# Patient Record
Sex: Female | Born: 2005 | Race: White | Hispanic: No | Marital: Single | Smoking: Never smoker
Health system: Southern US, Community
[De-identification: ages and names within clinical notes are randomized; demographics above are authoritative.]

## PROBLEM LIST (undated history)

## (undated) DIAGNOSIS — K219 Gastro-esophageal reflux disease without esophagitis: Secondary | ICD-10-CM

## (undated) DIAGNOSIS — J45909 Unspecified asthma, uncomplicated: Secondary | ICD-10-CM

## (undated) HISTORY — DX: Unspecified asthma, uncomplicated: J45.909

## (undated) HISTORY — PX: TONSILLECTOMY AND ADENOIDECTOMY: SUR1326

## (undated) HISTORY — PX: DENTAL SURGERY: SHX609

---
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
## 2023-01-04 ENCOUNTER — Encounter: Payer: Medicaid Other | Admitting: Obstetrics and Gynecology

## 2023-01-10 ENCOUNTER — Encounter: Payer: Medicaid Other | Admitting: Obstetrics

## 2023-01-25 ENCOUNTER — Telehealth: Payer: Self-pay

## 2023-01-25 NOTE — Telephone Encounter (Signed)
Pt calling; is having really really really bad pain in upper abd to the left side; pain is sharp then feels like someone is twisting something; pain comes and goes; just want to be sure everything is okay.  [phone]  Pt states a family member adv her to lie on her left side and sip on juice which pt is doing and it seems to be helping.  Adv pt to go to ER if pain continues in spite of lying on left side and sipping juice as we home no availability here.

## 2023-02-05 ENCOUNTER — Ambulatory Visit (INDEPENDENT_AMBULATORY_CARE_PROVIDER_SITE_OTHER): Payer: Medicaid Other | Admitting: Certified Nurse Midwife

## 2023-02-05 ENCOUNTER — Encounter: Payer: Self-pay | Admitting: Certified Nurse Midwife

## 2023-02-05 VITALS — BP 101/62 | HR 90 | Wt 130.2 lb

## 2023-02-05 DIAGNOSIS — Z23 Encounter for immunization: Secondary | ICD-10-CM

## 2023-02-05 DIAGNOSIS — Z3A29 29 weeks gestation of pregnancy: Secondary | ICD-10-CM

## 2023-02-05 DIAGNOSIS — Z3483 Encounter for supervision of other normal pregnancy, third trimester: Secondary | ICD-10-CM

## 2023-02-05 LAB — POCT URINALYSIS DIPSTICK OB
Bilirubin, UA: NEGATIVE
Blood, UA: NEGATIVE
Glucose, UA: NEGATIVE
Ketones, UA: NEGATIVE
Nitrite, UA: NEGATIVE
Spec Grav, UA: 1.015 (ref 1.010–1.025)
Urobilinogen, UA: 0.2 E.U./dL
pH, UA: 6 (ref 5.0–8.0)

## 2023-02-05 NOTE — Progress Notes (Signed)
ROB doing well. Feels good movement. Pt will return for lab visit this week to complete 28 wk  Glucose screen/RPR/CBC. Tdap done today, Blood transfusion consent completed, all questions answered. . Sample birth plan given, will follow up in upcoming visits. Discussed birth control after delivery, information pamphlet given.   Follow up 2 wk  for ROB or sooner if needed.    Doreene Burke, CNM

## 2023-02-05 NOTE — Patient Instructions (Signed)
[facility]  [facility]  [address], [place], [place] [address]  Phone: [phone]  [facility] (second location)  [address] [place], [place] [address]  Phone: [phone]  [facility] ([place]) [address], [place], [place] [address] Phone: [phone]  [facility]  [address] [place], [place] [address]  Phone: [phone] 

## 2023-02-06 ENCOUNTER — Encounter: Payer: Self-pay | Admitting: Certified Nurse Midwife

## 2023-02-08 ENCOUNTER — Other Ambulatory Visit: Payer: Medicaid Other

## 2023-02-09 LAB — 28 WEEK RH+PANEL
Basophils Absolute: 0.1 10*3/uL (ref 0.0–0.2)
Basos: 0 %
EOS (ABSOLUTE): 0.2 10*3/uL (ref 0.0–0.4)
Eos: 2 %
Gestational Diabetes Screen: 114 mg/dL (ref 70–139)
HIV Screen 4th Generation wRfx: NONREACTIVE
Hematocrit: 35.1 % (ref 34.0–46.6)
Hemoglobin: 11.9 g/dL (ref 11.1–15.9)
Immature Grans (Abs): 0.2 10*3/uL — ABNORMAL HIGH (ref 0.0–0.1)
Immature Granulocytes: 1 %
Lymphocytes Absolute: 2 10*3/uL (ref 0.7–3.1)
Lymphs: 18 %
MCH: 30.1 pg (ref 26.6–33.0)
MCHC: 33.9 g/dL (ref 31.5–35.7)
MCV: 89 fL (ref 79–97)
Monocytes Absolute: 0.8 10*3/uL (ref 0.1–0.9)
Monocytes: 7 %
Neutrophils Absolute: 8.2 10*3/uL — ABNORMAL HIGH (ref 1.4–7.0)
Neutrophils: 72 %
Platelets: 220 10*3/uL (ref 150–450)
RBC: 3.95 x10E6/uL (ref 3.77–5.28)
RDW: 12.4 % (ref 11.7–15.4)
RPR Ser Ql: NONREACTIVE
WBC: 11.4 10*3/uL — ABNORMAL HIGH (ref 3.4–10.8)

## 2023-02-26 ENCOUNTER — Encounter: Payer: Medicaid Other | Admitting: Obstetrics

## 2023-02-26 DIAGNOSIS — O09899 Supervision of other high risk pregnancies, unspecified trimester: Secondary | ICD-10-CM

## 2023-02-26 DIAGNOSIS — O99320 Drug use complicating pregnancy, unspecified trimester: Secondary | ICD-10-CM

## 2023-02-26 DIAGNOSIS — Z3480 Encounter for supervision of other normal pregnancy, unspecified trimester: Secondary | ICD-10-CM

## 2023-02-27 ENCOUNTER — Telehealth: Payer: Self-pay | Admitting: Obstetrics

## 2023-02-27 NOTE — Telephone Encounter (Signed)
Reached out to pt to reschedule ROB appt that was scheduled on 02/26/2023 at 10:15 with Melissa.  Was able to reschedule the appt for 03/12/23 at 10:15 with Hartley Barefoot.  Added the pt to [facility] in case a sooner appt opened up.

## 2023-03-12 ENCOUNTER — Ambulatory Visit (INDEPENDENT_AMBULATORY_CARE_PROVIDER_SITE_OTHER): Payer: Medicaid Other | Admitting: Certified Nurse Midwife

## 2023-03-12 VITALS — BP 101/67 | HR 79 | Wt 132.0 lb

## 2023-03-12 DIAGNOSIS — Z3A34 34 weeks gestation of pregnancy: Secondary | ICD-10-CM

## 2023-03-12 DIAGNOSIS — Z3483 Encounter for supervision of other normal pregnancy, third trimester: Secondary | ICD-10-CM

## 2023-03-12 DIAGNOSIS — Z3480 Encounter for supervision of other normal pregnancy, unspecified trimester: Secondary | ICD-10-CM

## 2023-03-12 MED ORDER — FAMOTIDINE 20 MG PO TABS: 20.0000 mg | ORAL_TABLET | Freq: Two times a day (BID) | ORAL | 1 refills | Status: DC

## 2023-03-12 NOTE — Progress Notes (Signed)
   PRENATAL VISIT NOTE  Subjective:  Whitney Rios is a 17 y.o. G2P1001 at [redacted]w[redacted]d being seen today for ongoing prenatal care.  She is currently monitored for the following issues for this low-risk pregnancy and has Supervision of other normal pregnancy, antepartum; Short interval between pregnancies affecting pregnancy, antepartum; and Marijuana use during pregnancy on their problem list.  Patient reports no complaints.  Contractions: Irregular. Vag. Bleeding: None.  Movement: Present. Denies leaking of fluid.   The following portions of the patient's history were reviewed and updated as appropriate: allergies, current medications, past family history, past medical history, past social history, past surgical history and problem list.   Objective:   Vitals:   03/12/23 1012  BP: 101/67  Pulse: 79  Weight: 132 lb (59.9 kg)   Total weight gain: 32 lb (14.5 kg) Fetal Status: Fetal Heart Rate (bpm): 145 Fundal Height: 33 cm Movement: Present  Presentation: Vertex   General:  Alert, oriented and cooperative. Patient is in no acute distress.  Skin: Skin is warm and dry. No rash noted.   Cardiovascular: Normal heart rate noted  Respiratory: Normal respiratory effort, no problems with respiration noted  Abdomen: Soft, gravid, appropriate for gestational age.  Pain/Pressure: Present     Pelvic: Cervical exam deferred        Extremities: Normal range of motion.  Edema: None  Mental Status: Normal mood and affect. Normal behavior. Normal judgment and thought content.   Assessment and Plan:  Pregnancy: G2P1001 at [redacted]w[redacted]d 1. Supervision of other normal pregnancy, antepartum  2. [redacted] weeks gestation of pregnancy   Preterm labor symptoms and general obstetric precautions including but not limited to vaginal bleeding, contractions, leaking of fluid and fetal movement were reviewed in detail with the patient. Please refer to After Visit Summary for other counseling recommendations.   GBS & Gc/Ct at  next visit. Options for contraception reviewed.  Return in 2 weeks (on 03/26/2023) for ROB.  No future appointments.  Dominica Severin, CNM

## 2023-03-12 NOTE — Patient Instructions (Signed)
Third Trimester of Pregnancy  The third trimester of pregnancy is from week 28 through week 40. This is months 7 through 9. The third trimester is a time when the unborn baby (fetus) is growing rapidly. At the end of the ninth month, the fetus is about 20 inches long and weighs 6-10 pounds. Body changes during your third trimester During the third trimester, your body will continue to go through many changes. The changes vary and generally return to normal after your baby is born. Physical changes Your weight will continue to increase. You can expect to gain 25-35 pounds (11-16 kg) by the end of the pregnancy if you begin pregnancy at a normal weight. If you are underweight, you can expect to gain 28-40 lb (about 13-18 kg), and if you are overweight, you can expect to gain 15-25 lb (about 7-11 kg). You may begin to get stretch marks on your hips, abdomen, and breasts. Your breasts will continue to grow and may hurt. A yellow fluid (colostrum) may leak from your breasts. This is the first milk you are producing for your baby. You may have changes in your hair. These can include thickening of your hair, rapid growth, and changes in texture. Some people also have hair loss during or after pregnancy, or hair that feels dry or thin. Your belly button may stick out. You may notice more swelling in your hands, face, or ankles. Health changes You may have heartburn. You may have constipation. You may develop hemorrhoids. You may develop swollen, bulging veins (varicose veins) in your legs. You may have increased body aches in the pelvis, back, or thighs. This is due to weight gain and increased hormones that are relaxing your joints. You may have increased tingling or numbness in your hands, arms, and legs. The skin on your abdomen may also feel numb. You may feel short of breath because of your expanding uterus. Other changes You may urinate more often because the fetus is moving lower into your pelvis  and pressing on your bladder. You may have more problems sleeping. This may be caused by the size of your abdomen, an increased need to urinate, and an increase in your body's metabolism. You may notice the fetus "dropping," or moving lower in your abdomen (lightening). You may have increased vaginal discharge. You may notice that you have pain around your pelvic bone as your uterus distends. Follow these instructions at home: Medicines Follow your health care provider's instructions regarding medicine use. Specific medicines may be either safe or unsafe to take during pregnancy. Do not take any medicines unless approved by your health care provider. Take a prenatal vitamin that contains at least 600 micrograms (mcg) of folic acid. Eating and drinking Eat a healthy diet that includes fresh fruits and vegetables, whole grains, good sources of protein such as meat, eggs, or tofu, and low-fat dairy products. Avoid raw meat and unpasteurized juice, milk, and cheese. These carry germs that can harm you and your baby. Eat 4 or 5 small meals rather than 3 large meals a day. You may need to take these actions to prevent or treat constipation: Drink enough fluid to keep your urine pale yellow. Eat foods that are high in fiber, such as beans, whole grains, and fresh fruits and vegetables. Limit foods that are high in fat and processed sugars, such as fried or sweet foods. Activity Exercise only as directed by your health care provider. Most people can continue their usual exercise routine during pregnancy. Try   to exercise for 30 minutes at least 5 days a week. Stop exercising if you experience contractions in the uterus. Stop exercising if you develop pain or cramping in the lower abdomen or lower back. Avoid heavy lifting. Do not exercise if it is very hot or humid or if you are at a high altitude. If you choose to, you may continue to have sex unless your health care provider tells you not  to. Relieving pain and discomfort Take frequent breaks and rest with your legs raised (elevated) if you have leg cramps or low back pain. Take warm sitz baths to soothe any pain or discomfort caused by hemorrhoids. Use hemorrhoid cream if your health care provider approves. Wear a supportive bra to prevent discomfort from breast tenderness. If you develop varicose veins: Wear support hose as told by your health care provider. Elevate your feet for 15 minutes, 3-4 times a day. Limit salt in your diet. Safety Talk to your health care provider before traveling far distances. Do not use hot tubs, steam rooms, or saunas. Wear your seat belt at all times when driving or riding in a car. Talk with your health care provider if someone is verbally or physically abusive to you. Preparing for birth To prepare for the arrival of your baby: Take prenatal classes to understand, practice, and ask questions about labor and delivery. Visit the hospital and tour the maternity area. Purchase a rear-facing car seat and make sure you know how to install it in your car. Prepare the baby's room or sleeping area. Make sure to remove all pillows and stuffed animals from the baby's crib to prevent suffocation. General instructions Avoid cat litter boxes and soil used by cats. These carry germs that can cause birth defects in the baby. If you have a cat, ask someone to clean the litter box for you. Do not douche or use tampons. Do not use scented sanitary pads. Do not use any products that contain nicotine or tobacco, such as cigarettes, e-cigarettes, and chewing tobacco. If you need help quitting, ask your health care provider. Do not use any herbal remedies, illegal drugs, or medicines that were not prescribed to you. Chemicals in these products can harm your baby. Do not drink alcohol. You will have more frequent prenatal exams during the third trimester. During a routine prenatal visit, your health care provider  will do a physical exam, perform tests, and discuss your overall health. Keep all follow-up visits. This is important. Where to find more information American Pregnancy Association: americanpregnancy.org American College of Obstetricians and Gynecologists: acog.org/en/Womens%20Health/Pregnancy Office on Women's Health: womenshealth.gov/pregnancy Contact a health care provider if you have: A fever. Mild pelvic cramps, pelvic pressure, or nagging pain in your abdominal area or lower back. Vomiting or diarrhea. Bad-smelling vaginal discharge or foul-smelling urine. Pain when you urinate. A headache that does not go away when you take medicine. Visual changes or see spots in front of your eyes. Get help right away if: Your water breaks. You have regular contractions less than 5 minutes apart. You have spotting or bleeding from your vagina. You have severe abdominal pain. You have difficulty breathing. You have chest pain. You have fainting spells. You have not felt your baby move for the time period told by your health care provider. You have new or increased pain, swelling, or redness in an arm or leg. Summary The third trimester of pregnancy is from week 28 through week 40 (months 7 through 9). You may have more problems   sleeping. This can be caused by the size of your abdomen, an increased need to urinate, and an increase in your body's metabolism. You will have more frequent prenatal exams during the third trimester. Keep all follow-up visits. This is important. This information is not intended to replace advice given to you by your health care provider. Make sure you discuss any questions you have with your health care provider. Document Revised: 03/03/2020 Document Reviewed: 01/08/2020 Elsevier Patient Education  2024 Elsevier Inc.  Group B Streptococcus Infection During Pregnancy Group B Streptococcus (GBS) is a type of bacteria that is often found in healthy people. It is  commonly found in the rectum, vagina, and intestines. In people who are healthy and not pregnant, the bacteria rarely cause serious illness or complications. However, women who test positive for GBS during pregnancy can pass the bacteria to the baby during childbirth. This can cause serious infection in the baby after birth. Women with GBS may also have infections during their pregnancy or soon after childbirth. The infections include urinary tract infections (UTIs) or infections of the uterus. GBS also increases a woman's risk of complications during pregnancy, such as early labor or delivery, miscarriage, or stillbirth. Routine testing for GBS is recommended for all pregnant women. What are the causes? This condition is caused by bacteria called Streptococcus agalactiae. What increases the risk? You may have a higher risk for GBS infection during pregnancy if you had one during a past pregnancy. What are the signs or symptoms? In most cases, GBS infection does not cause symptoms in pregnant women. If symptoms exist, they may include: Labor that starts before the 37th week of pregnancy. A UTI or bladder infection. This may cause a fever, frequent urination, or pain and burning during urination. Fever during labor. There can also be a rapid heartbeat in the mother or baby. Rare but serious symptoms of a GBS infection in women include: Blood infection (septicemia). This may cause fever, chills, or confusion. Lung infection (pneumonia). This may cause fever, chills, cough, rapid breathing, chest pain, or difficulty breathing. Bone, joint, skin, or soft tissue infection. How is this diagnosed? You may be screened for GBS between week 35 and week 37 of pregnancy. If you have symptoms of preterm labor, you may be screened earlier. This condition is diagnosed based on lab test results from: A swab of fluid from the vagina and rectum. A urine sample. How is this treated? This condition is treated with  antibiotic medicine. Antibiotic medicine may be given: To you when you go into labor, or as soon as your water breaks. The medicines will continue until after you give birth. If you are having a cesarean delivery, you do not need antibiotics unless your water has broken. To your baby, if he or she requires treatment. Your health care provider will check your baby to decide if he or she needs antibiotics to prevent a serious infection. Follow these instructions at home: Take over-the-counter and prescription medicines only as told by your health care provider. Take your antibiotic medicine as told by your health care provider. Do not stop taking the antibiotic even if you start to feel better. Keep all pre-birth (prenatal) visits and follow-up visits as told by your health care provider. This is important. Contact a health care provider if: You have pain or burning when you urinate. You have to urinate more often than usual. You have a fever or chills. You develop a bad-smelling vaginal discharge. Get help right away if:   Your water breaks. You go into labor. You have severe pain in your abdomen. You have difficulty breathing. You have chest pain. These symptoms may represent a serious problem that is an emergency. Do not wait to see if the symptoms will go away. Get medical help right away. Call your local emergency services (911 in the [place] Do not drive yourself to the hospital. Summary GBS is a type of bacteria that is common in healthy people. During pregnancy, colonization with GBS can cause serious complications for you or your baby. Your health care provider will screen you between 35 and 37 weeks of pregnancy to determine if you are colonized with GBS. If you are colonized with GBS during pregnancy, your health care provider will recommend antibiotics through an IV during labor. After delivery, your baby will be evaluated for complications related to potential GBS infection and may  require antibiotics to prevent a serious infection. This information is not intended to replace advice given to you by your health care provider. Make sure you discuss any questions you have with your health care provider. Document Revised: 09/11/2022 Document Reviewed: 09/11/2022 Elsevier Patient Education  2024 Elsevier Inc.  

## 2023-03-23 DIAGNOSIS — Z2839 Other underimmunization status: Secondary | ICD-10-CM | POA: Insufficient documentation

## 2023-03-27 ENCOUNTER — Other Ambulatory Visit (HOSPITAL_COMMUNITY)
Admission: RE | Admit: 2023-03-27 | Discharge: 2023-03-27 | Disposition: A | Discharge status: Home / Self Care | Payer: Medicaid Other | Source: Ambulatory Visit

## 2023-03-27 ENCOUNTER — Ambulatory Visit (INDEPENDENT_AMBULATORY_CARE_PROVIDER_SITE_OTHER): Payer: Medicaid Other

## 2023-03-27 VITALS — BP 114/70 | HR 96 | Wt 138.0 lb

## 2023-03-27 DIAGNOSIS — Z3480 Encounter for supervision of other normal pregnancy, unspecified trimester: Secondary | ICD-10-CM | POA: Insufficient documentation

## 2023-03-27 DIAGNOSIS — O365990 Maternal care for other known or suspected poor fetal growth, unspecified trimester, not applicable or unspecified: Secondary | ICD-10-CM | POA: Insufficient documentation

## 2023-03-27 DIAGNOSIS — O26849 Uterine size-date discrepancy, unspecified trimester: Secondary | ICD-10-CM | POA: Insufficient documentation

## 2023-03-27 DIAGNOSIS — Z2839 Other underimmunization status: Secondary | ICD-10-CM

## 2023-03-27 DIAGNOSIS — Z3A36 36 weeks gestation of pregnancy: Secondary | ICD-10-CM

## 2023-03-27 DIAGNOSIS — O26843 Uterine size-date discrepancy, third trimester: Secondary | ICD-10-CM

## 2023-03-27 LAB — POCT URINALYSIS DIPSTICK OB
Bilirubin, UA: NEGATIVE
Blood, UA: NEGATIVE
Glucose, UA: NEGATIVE
Ketones, UA: NEGATIVE
Leukocytes, UA: NEGATIVE
Nitrite, UA: NEGATIVE
POC,PROTEIN,UA: NEGATIVE
Spec Grav, UA: 1.01 (ref 1.010–1.025)
Urobilinogen, UA: 1 E.U./dL
pH, UA: 6.5 (ref 5.0–8.0)

## 2023-03-27 NOTE — Progress Notes (Signed)
    Return Prenatal Note   Assessment/Plan   Plan  17 y.o. G2P1001 at [redacted]w[redacted]d presents for follow-up OB visit. Reviewed prenatal record including previous visit note.  Supervision of other normal pregnancy, antepartum GBS and GC/CT swabs collected today. Provided resources for encouraging timely labor. Reviewed labor warning signs and expectations for birth. Instructed to call office or come to hospital with persistent headache, vision changes, regular contractions, leaking of fluid, decreased fetal movement or vaginal bleeding.  Significant discrepancy between uterine size and clinical dates, antepartum Measuring 32 cm at 36 weeks today. Growth ultrasound ordered.    Orders Placed This Encounter  Procedures   Culture, beta strep (group b only)   US OB Follow Up    Standing Status:   Future    Standing Expiration Date:   03/26/2024    Order Specific Question:   Reason for Exam (SYMPTOM  OR DIAGNOSIS REQUIRED)    Answer:   S<D    Order Specific Question:   Preferred Imaging Location?    Answer:   Internal   POC Urinalysis Dipstick OB   Return in about 1 week (around 04/03/2023) for ROB.   Future Appointments  Date Time Provider Department Center  04/03/2023  1:55 PM Linzie Collin, MD [facility] None    For next visit:   growth ultrasound     Subjective   17 y.o. G2P1001 at [redacted]w[redacted]d presents for this follow-up prenatal visit.  Patient has no concerns.  Patient reports: Movement: Present Contractions: Irritability  Objective   Flow sheet Vitals: Pulse Rate: 96 BP: 114/70 Fundal Height: 32 cm Fetal Heart Rate (bpm): 125 Presentation: Vertex Total weight gain: 38 lb (17.2 kg)  General Appearance  No acute distress, well appearing, and well nourished Pulmonary   Normal work of breathing Neurologic   Alert and oriented to person, place, and time Psychiatric   Mood and affect within normal limits  Lindalou Hose Envy Meno, CNM  03/26/2410:28 AM

## 2023-03-27 NOTE — Assessment & Plan Note (Addendum)
GBS and GC/CT swabs collected today. Provided resources for encouraging timely labor. Reviewed labor warning signs and expectations for birth. Instructed to call office or come to hospital with persistent headache, vision changes, regular contractions, leaking of fluid, decreased fetal movement or vaginal bleeding.

## 2023-03-27 NOTE — Assessment & Plan Note (Signed)
Measuring 32 cm at 36 weeks today. Growth ultrasound ordered.

## 2023-03-28 LAB — CERVICOVAGINAL ANCILLARY ONLY
Chlamydia: NEGATIVE
Comment: NEGATIVE
Comment: NORMAL
Neisseria Gonorrhea: NEGATIVE

## 2023-03-29 ENCOUNTER — Telehealth: Payer: Self-pay

## 2023-03-29 ENCOUNTER — Ambulatory Visit
Admission: RE | Admit: 2023-03-29 | Discharge: 2023-03-29 | Disposition: A | Discharge status: Home / Self Care | Payer: Medicaid Other | Source: Ambulatory Visit

## 2023-03-29 ENCOUNTER — Other Ambulatory Visit: Payer: Self-pay

## 2023-03-29 DIAGNOSIS — O26849 Uterine size-date discrepancy, unspecified trimester: Secondary | ICD-10-CM | POA: Diagnosis present

## 2023-03-29 DIAGNOSIS — O365990 Maternal care for other known or suspected poor fetal growth, unspecified trimester, not applicable or unspecified: Secondary | ICD-10-CM

## 2023-03-29 NOTE — Telephone Encounter (Signed)
Call to patient to confirm induction scheduled for 6/25 at midnight. Patient instructed to come to [facility] emergency department. Patient expressed understanding of instructions.

## 2023-03-29 NOTE — Telephone Encounter (Signed)
Verified name and DOB. Reviewed ultrasound findings from today of severe FGR <1%. Discussed patient with Dr. Valentino Saxon who recommends BPP with doppler as soon as possible and plan for induction of labor at 37 wks. MFM scheduling has been contacted to set up [place] appointments. Patient will be scheduled for induction on 6/24 or 6/25. She is aware of the plan and expressed understanding of the recommendation.

## 2023-03-30 ENCOUNTER — Encounter: Payer: Self-pay | Admitting: *Deleted

## 2023-03-30 ENCOUNTER — Ambulatory Visit: Payer: Medicaid Other | Attending: Obstetrics and Gynecology

## 2023-03-30 ENCOUNTER — Other Ambulatory Visit: Payer: Self-pay

## 2023-03-30 ENCOUNTER — Ambulatory Visit: Payer: Medicaid Other | Admitting: *Deleted

## 2023-03-30 VITALS — BP 115/55 | HR 79

## 2023-03-30 DIAGNOSIS — O365990 Maternal care for other known or suspected poor fetal growth, unspecified trimester, not applicable or unspecified: Secondary | ICD-10-CM

## 2023-03-30 DIAGNOSIS — F1290 Cannabis use, unspecified, uncomplicated: Secondary | ICD-10-CM

## 2023-03-30 DIAGNOSIS — O365930 Maternal care for other known or suspected poor fetal growth, third trimester, not applicable or unspecified: Secondary | ICD-10-CM

## 2023-03-30 DIAGNOSIS — O99323 Drug use complicating pregnancy, third trimester: Secondary | ICD-10-CM | POA: Diagnosis not present

## 2023-03-30 DIAGNOSIS — Z3A36 36 weeks gestation of pregnancy: Secondary | ICD-10-CM | POA: Diagnosis not present

## 2023-03-30 DIAGNOSIS — F17210 Nicotine dependence, cigarettes, uncomplicated: Secondary | ICD-10-CM | POA: Diagnosis present

## 2023-03-30 LAB — CULTURE, BETA STREP (GROUP B ONLY): Strep Gp B Culture: NEGATIVE

## 2023-03-30 NOTE — Procedures (Signed)
Whitney Rios 24-Apr-2006 [redacted]w[redacted]d  Fetus A Non-Stress Test Interpretation for 03/30/23  Indication: IUGR and smoker  Fetal Heart Rate A Mode: External Baseline Rate (A): 125 bpm Variability: Moderate Accelerations: 15 x 15 Decelerations: None Multiple birth?: No  Uterine Activity Mode: Toco Contraction Frequency (min): occas Contraction Duration (sec): 40-60 Contraction Quality: Mild Resting Tone Palpated: Relaxed  Interpretation (Fetal Testing) Nonstress Test Interpretation: Reactive Overall Impression: Reassuring for gestational age Comments: Tracing reviewed by Dr. Judeth Cornfield

## 2023-04-02 ENCOUNTER — Other Ambulatory Visit: Payer: Self-pay | Admitting: Licensed Practical Nurse

## 2023-04-02 DIAGNOSIS — Z3490 Encounter for supervision of normal pregnancy, unspecified, unspecified trimester: Secondary | ICD-10-CM

## 2023-04-03 ENCOUNTER — Inpatient Hospital Stay: Payer: Medicaid Other | Admitting: Anesthesiology

## 2023-04-03 ENCOUNTER — Other Ambulatory Visit: Payer: Self-pay

## 2023-04-03 ENCOUNTER — Encounter: Payer: Medicaid Other | Admitting: Obstetrics and Gynecology

## 2023-04-03 ENCOUNTER — Inpatient Hospital Stay
Admission: EM | Admit: 2023-04-03 | Discharge: 2023-04-05 | DRG: 807 | Disposition: A | Discharge status: Home / Self Care | Payer: Medicaid Other | Attending: Certified Nurse Midwife | Admitting: Certified Nurse Midwife

## 2023-04-03 ENCOUNTER — Encounter: Payer: Self-pay | Admitting: Obstetrics and Gynecology

## 2023-04-03 DIAGNOSIS — Z3A37 37 weeks gestation of pregnancy: Secondary | ICD-10-CM

## 2023-04-03 DIAGNOSIS — O365930 Maternal care for other known or suspected poor fetal growth, third trimester, not applicable or unspecified: Secondary | ICD-10-CM | POA: Diagnosis present

## 2023-04-03 DIAGNOSIS — Z8249 Family history of ischemic heart disease and other diseases of the circulatory system: Secondary | ICD-10-CM

## 2023-04-03 DIAGNOSIS — Z3490 Encounter for supervision of normal pregnancy, unspecified, unspecified trimester: Secondary | ICD-10-CM | POA: Diagnosis present

## 2023-04-03 LAB — CBC
HCT: 33.8 % — ABNORMAL LOW (ref 36.0–46.0)
Hemoglobin: 11.4 g/dL — ABNORMAL LOW (ref 12.0–15.0)
MCH: 29.1 pg (ref 26.0–34.0)
MCHC: 33.7 g/dL (ref 30.0–36.0)
MCV: 86.2 fL (ref 80.0–100.0)
Platelets: 243 10*3/uL (ref 150–400)
RBC: 3.92 MIL/uL (ref 3.87–5.11)
RDW: 13 % (ref 11.5–15.5)
WBC: 15.2 10*3/uL — ABNORMAL HIGH (ref 4.0–10.5)
nRBC: 0 % (ref 0.0–0.2)

## 2023-04-03 LAB — URINE DRUG SCREEN, QUALITATIVE (ARMC ONLY)
Amphetamines, Ur Screen: NOT DETECTED
Barbiturates, Ur Screen: NOT DETECTED
Benzodiazepine, Ur Scrn: NOT DETECTED
Cannabinoid 50 Ng, Ur ~~LOC~~: POSITIVE — AB
Cocaine Metabolite,Ur ~~LOC~~: NOT DETECTED
MDMA (Ecstasy)Ur Screen: NOT DETECTED
Methadone Scn, Ur: NOT DETECTED
Opiate, Ur Screen: NOT DETECTED
Phencyclidine (PCP) Ur S: NOT DETECTED
Tricyclic, Ur Screen: NOT DETECTED

## 2023-04-03 LAB — RPR: RPR Ser Ql: NONREACTIVE

## 2023-04-03 LAB — TYPE AND SCREEN: Antibody Screen: NEGATIVE

## 2023-04-03 MED ORDER — LACTATED RINGERS IV SOLN
500.0000 mL | INTRAVENOUS | Status: DC | PRN
  Administered 2023-04-03 (×2): 250 mL via INTRAVENOUS

## 2023-04-03 MED ORDER — BENZOCAINE-MENTHOL 20-0.5 % EX AERO
1.0000 | INHALATION_SPRAY | CUTANEOUS | Status: DC | PRN
  Administered 2023-04-03: 1 via TOPICAL
  Filled 2023-04-03 (×2): qty 56

## 2023-04-03 MED ORDER — PHENYLEPHRINE 80 MCG/ML (10ML) SYRINGE FOR IV PUSH (FOR BLOOD PRESSURE SUPPORT): 80.0000 ug | PREFILLED_SYRINGE | INTRAVENOUS | Status: DC | PRN

## 2023-04-03 MED ORDER — ONDANSETRON HCL 4 MG PO TABS: 4.0000 mg | ORAL_TABLET | ORAL | Status: DC | PRN

## 2023-04-03 MED ORDER — MISOPROSTOL 25 MCG QUARTER TABLET
25.0000 ug | ORAL_TABLET | Freq: Once | ORAL | Status: AC
  Administered 2023-04-03: 25 ug via ORAL
  Filled 2023-04-03: qty 1

## 2023-04-03 MED ORDER — FENTANYL-BUPIVACAINE-NACL 0.5-0.125-0.9 MG/250ML-% EP SOLN
12.0000 mL/h | EPIDURAL | Status: DC | PRN
  Administered 2023-04-03: 12 mL/h via EPIDURAL

## 2023-04-03 MED ORDER — EPHEDRINE 5 MG/ML INJ
10.0000 mg | INTRAVENOUS | Status: DC | PRN
  Administered 2023-04-03: 10 mg via INTRAVENOUS

## 2023-04-03 MED ORDER — OXYTOCIN-SODIUM CHLORIDE 30-0.9 UT/500ML-% IV SOLN
1.0000 m[IU]/min | INTRAVENOUS | Status: DC
  Administered 2023-04-03: 2 m[IU]/min via INTRAVENOUS
  Filled 2023-04-03: qty 500

## 2023-04-03 MED ORDER — MISOPROSTOL 25 MCG QUARTER TABLET
25.0000 ug | ORAL_TABLET | Freq: Once | ORAL | Status: AC
  Administered 2023-04-03: 25 ug via VAGINAL
  Filled 2023-04-03: qty 1

## 2023-04-03 MED ORDER — FENTANYL-BUPIVACAINE-NACL 0.5-0.125-0.9 MG/250ML-% EP SOLN
EPIDURAL | Status: AC
  Filled 2023-04-03: qty 250

## 2023-04-03 MED ORDER — OXYTOCIN BOLUS FROM INFUSION
333.0000 mL | Freq: Once | INTRAVENOUS | Status: AC
  Administered 2023-04-03: 333 mL via INTRAVENOUS

## 2023-04-03 MED ORDER — ACETAMINOPHEN 325 MG PO TABS
650.0000 mg | ORAL_TABLET | ORAL | Status: DC | PRN
  Administered 2023-04-03 – 2023-04-04 (×3): 650 mg via ORAL
  Filled 2023-04-03 (×4): qty 2

## 2023-04-03 MED ORDER — FENTANYL CITRATE (PF) 100 MCG/2ML IJ SOLN
50.0000 ug | INTRAMUSCULAR | Status: DC | PRN
  Administered 2023-04-03: 50 ug via INTRAVENOUS
  Filled 2023-04-03: qty 2

## 2023-04-03 MED ORDER — IBUPROFEN 600 MG PO TABS
600.0000 mg | ORAL_TABLET | Freq: Four times a day (QID) | ORAL | Status: DC
  Administered 2023-04-03 – 2023-04-05 (×7): 600 mg via ORAL
  Filled 2023-04-03 (×7): qty 1

## 2023-04-03 MED ORDER — WITCH HAZEL-GLYCERIN EX PADS
1.0000 | MEDICATED_PAD | CUTANEOUS | Status: DC | PRN
  Administered 2023-04-03: 1 via TOPICAL
  Filled 2023-04-03 (×2): qty 100

## 2023-04-03 MED ORDER — DIBUCAINE (PERIANAL) 1 % EX OINT: 1.0000 | TOPICAL_OINTMENT | CUTANEOUS | Status: DC | PRN

## 2023-04-03 MED ORDER — MISOPROSTOL 25 MCG QUARTER TABLET
25.0000 ug | ORAL_TABLET | ORAL | Status: DC | PRN
  Administered 2023-04-03: 25 ug via VAGINAL
  Filled 2023-04-03: qty 1

## 2023-04-03 MED ORDER — PRENATAL MULTIVITAMIN CH
1.0000 | ORAL_TABLET | Freq: Every day | ORAL | Status: DC
  Administered 2023-04-04: 1 via ORAL
  Filled 2023-04-03: qty 1

## 2023-04-03 MED ORDER — LACTATED RINGERS IV SOLN: INTRAVENOUS | Status: DC

## 2023-04-03 MED ORDER — OXYTOCIN 10 UNIT/ML IJ SOLN
INTRAMUSCULAR | Status: AC
  Filled 2023-04-03: qty 2

## 2023-04-03 MED ORDER — MISOPROSTOL 25 MCG QUARTER TABLET
25.0000 ug | ORAL_TABLET | ORAL | Status: DC
  Administered 2023-04-03: 25 ug via ORAL
  Filled 2023-04-03: qty 1

## 2023-04-03 MED ORDER — SODIUM CHLORIDE 0.9 % IV SOLN
INTRAVENOUS | Status: DC | PRN
  Administered 2023-04-03 (×2): 5 mL via EPIDURAL

## 2023-04-03 MED ORDER — HYDROXYZINE HCL 25 MG PO TABS: 50.0000 mg | ORAL_TABLET | Freq: Four times a day (QID) | ORAL | Status: DC | PRN

## 2023-04-03 MED ORDER — DIPHENHYDRAMINE HCL 25 MG PO CAPS: 25.0000 mg | ORAL_CAPSULE | Freq: Four times a day (QID) | ORAL | Status: DC | PRN

## 2023-04-03 MED ORDER — SIMETHICONE 80 MG PO CHEW: 80.0000 mg | CHEWABLE_TABLET | ORAL | Status: DC | PRN

## 2023-04-03 MED ORDER — LIDOCAINE HCL (PF) 1 % IJ SOLN
INTRAMUSCULAR | Status: DC | PRN
  Administered 2023-04-03: 3 mL via SUBCUTANEOUS

## 2023-04-03 MED ORDER — COCONUT OIL OIL: 1.0000 | TOPICAL_OIL | Status: DC | PRN

## 2023-04-03 MED ORDER — LIDOCAINE-EPINEPHRINE (PF) 1.5 %-1:200000 IJ SOLN
INTRAMUSCULAR | Status: DC | PRN
  Administered 2023-04-03: 3 mL via EPIDURAL

## 2023-04-03 MED ORDER — ONDANSETRON HCL 4 MG/2ML IJ SOLN
4.0000 mg | Freq: Four times a day (QID) | INTRAMUSCULAR | Status: DC | PRN
  Administered 2023-04-03 (×2): 4 mg via INTRAVENOUS
  Filled 2023-04-03 (×2): qty 2

## 2023-04-03 MED ORDER — MISOPROSTOL 200 MCG PO TABS
ORAL_TABLET | ORAL | Status: AC
  Filled 2023-04-03: qty 4

## 2023-04-03 MED ORDER — SOD CITRATE-CITRIC ACID 500-334 MG/5ML PO SOLN: 30.0000 mL | ORAL | Status: DC | PRN

## 2023-04-03 MED ORDER — ZOLPIDEM TARTRATE 5 MG PO TABS: 5.0000 mg | ORAL_TABLET | Freq: Every evening | ORAL | Status: DC | PRN

## 2023-04-03 MED ORDER — DIPHENHYDRAMINE HCL 50 MG/ML IJ SOLN: 12.5000 mg | INTRAMUSCULAR | Status: DC | PRN

## 2023-04-03 MED ORDER — LIDOCAINE HCL (PF) 1 % IJ SOLN: 30.0000 mL | INTRAMUSCULAR | Status: DC | PRN

## 2023-04-03 MED ORDER — TERBUTALINE SULFATE 1 MG/ML IJ SOLN
0.2500 mg | Freq: Once | INTRAMUSCULAR | Status: AC | PRN
  Administered 2023-04-03: 0.25 mg via SUBCUTANEOUS
  Filled 2023-04-03: qty 1

## 2023-04-03 MED ORDER — LIDOCAINE HCL (PF) 1 % IJ SOLN
INTRAMUSCULAR | Status: AC
  Filled 2023-04-03: qty 30

## 2023-04-03 MED ORDER — LACTATED RINGERS IV SOLN
500.0000 mL | Freq: Once | INTRAVENOUS | Status: AC
  Administered 2023-04-03: 500 mL via INTRAVENOUS

## 2023-04-03 MED ORDER — OXYTOCIN-SODIUM CHLORIDE 30-0.9 UT/500ML-% IV SOLN
2.5000 [IU]/h | INTRAVENOUS | Status: DC
  Administered 2023-04-03: 2.5 [IU]/h via INTRAVENOUS

## 2023-04-03 MED ORDER — ONDANSETRON HCL 4 MG/2ML IJ SOLN: 4.0000 mg | INTRAMUSCULAR | Status: DC | PRN

## 2023-04-03 MED ORDER — DOCUSATE SODIUM 100 MG PO CAPS
100.0000 mg | ORAL_CAPSULE | Freq: Two times a day (BID) | ORAL | Status: DC
  Administered 2023-04-03 – 2023-04-05 (×4): 100 mg via ORAL
  Filled 2023-04-03 (×3): qty 1

## 2023-04-03 MED ORDER — AMMONIA AROMATIC IN INHA
RESPIRATORY_TRACT | Status: AC
  Filled 2023-04-03: qty 10

## 2023-04-03 MED ORDER — EPHEDRINE 5 MG/ML INJ
10.0000 mg | INTRAVENOUS | Status: DC | PRN
  Filled 2023-04-03: qty 5

## 2023-04-03 MED ORDER — TETANUS-DIPHTH-ACELL PERTUSSIS 5-2.5-18.5 LF-MCG/0.5 IM SUSY
0.5000 mL | PREFILLED_SYRINGE | Freq: Once | INTRAMUSCULAR | Status: DC
  Filled 2023-04-03: qty 0.5

## 2023-04-03 NOTE — Anesthesia Procedure Notes (Signed)
Epidural Patient location during procedure: OB Start time: 04/03/2023 8:53 AM End time: 04/03/2023 8:57 AM  Staffing Anesthesiologist: Lenard Simmer, MD Performed: anesthesiologist   Preanesthetic Checklist Completed: patient identified, IV checked, site marked, risks and benefits discussed, surgical consent, monitors and equipment checked, pre-op evaluation and timeout performed  Epidural Patient position: sitting Prep: ChloraPrep Patient monitoring: heart rate, continuous pulse ox and blood pressure Approach: midline Location: L3-L4 Injection technique: LOR saline  Needle:  Needle type: Tuohy  Needle gauge: 17 G Needle length: 9 cm Needle insertion depth: 4 cm Catheter type: closed end flexible Catheter size: 19 Gauge Catheter at skin depth: 9 cm Test dose: negative and 1.5% lidocaine with Epi 1:200 K  Assessment Sensory level: T10 Events: blood not aspirated, no cerebrospinal fluid, injection not painful, no injection resistance, no paresthesia and negative IV test  Additional Notes 1st attempt Pt. Evaluated and documentation done after procedure finished. Patient identified. Risks/Benefits/Options discussed with patient including but not limited to bleeding, infection, nerve damage, paralysis, failed block, incomplete pain control, headache, blood pressure changes, nausea, vomiting, reactions to medication both or allergic, itching and postpartum back pain. Confirmed with bedside nurse the patient's most recent platelet count. Confirmed with patient that they are not currently taking any anticoagulation, have any bleeding history or any family history of bleeding disorders. Patient expressed understanding and wished to proceed. All questions were answered. Sterile technique was used throughout the entire procedure. Please see nursing notes for vital signs. Test dose was given through epidural catheter and negative prior to continuing to dose epidural or start infusion. Warning  signs of high block given to the patient including shortness of breath, tingling/numbness in hands, complete motor block, or any concerning symptoms with instructions to call for help. Patient was given instructions on fall risk and not to get out of bed. All questions and concerns addressed with instructions to call with any issues or inadequate analgesia.    Patient tolerated the insertion well without immediate complications.Reason for block:procedure for pain

## 2023-04-03 NOTE — Progress Notes (Signed)
Whitney Rios is a 17 y.o. G2P1001 at [redacted]w[redacted]d by ultrasound admitted for induction of labor due to Poor fetal growth.She started her IOL after midnight , and has been dosed with Cytotec twice;last dosing was at 0500  Subjective:She is teary, moving constantly in the bed, and  is c/o both back and lower abdominal pain. The FOB and his mother are present.   Objective: BP (!) 123/98   Pulse 93   Temp 99.5 F (37.5 C) (Oral)   Resp 18   Ht 5\' 1"  (1.549 m)   Wt 63 kg   LMP 07/06/2022 (Approximate)   SpO2 99%   BMI 26.24 kg/m  No intake/output data recorded. No intake/output data recorded.  FHT:  FHR: unable to determine baseline due to variability and repetitive decels bpm, variability: moderate,  accelerations:  Abscent,  decelerations:  Present repetitive decel vs variables UC:   regular, every 1.5 minutes SVE:   Dilation: 3 Effacement (%): 70 Station: -2, -3 Exam by:: Liana Crocker CNM  Labs: Lab Results  Component Value Date   WBC 15.2 (H) 04/03/2023   HGB 11.4 (L) 04/03/2023   HCT 33.8 (L) 04/03/2023   MCV 86.2 04/03/2023   PLT 243 04/03/2023    Assessment / Plan: Induction of labor due to IUGR,  has had dosing with Cytotec  Will administer Terbutaline sq to space out contractions and rest uterus. Continue close EFM     And follow FHTs closely. Dr. Logan Bores notified via text of FHT tracing and SVE result. Fetal Wellbeing:  Category II Pain Control:  anesthesia requested for epidural placement. I/D:  n/a Anticipated MOD:  NSVD  IV bolus ordered. Multiple position changes to increase perfusion to the fetus.   Mirna Mires, CNM 04/03/2023, 9:36 AM

## 2023-04-03 NOTE — H&P (Signed)
OB History & Physical   History of Present Illness:  Chief Complaint:   HPI:  Whitney Rios is a 17 y.o. G3P1001 female at [redacted]w[redacted]d dated by 8wkUS.  She presents to L&D for IOL secondary to IUGR. She had an [place] on 6/21 that showed an EFW of 1848 grams, < 1. She hd early and regular prenatal care. She endorses +FM, denies LOF/VB. She is feeling contractions.     Pregnancy Issues: 1. Tobacco use 2. MJ use 3. Asthma 4. Severe IUGR   Maternal Medical History:   Past Medical History:  Diagnosis Date   Asthma    GERD (gastroesophageal reflux disease)     Past Surgical History:  Procedure Laterality Date   DENTAL SURGERY     TONSILLECTOMY AND ADENOIDECTOMY      No Known Allergies  Prior to Admission medications   Medication Sig Start Date End Date Taking? Authorizing Provider  famotidine (PEPCID) 20 MG tablet Take 1 tablet (20 mg total) by mouth 2 (two) times daily. 03/12/23 03/11/24 Yes Dominica Severin, CNM  Pediatric Multiple Vitamins (FLINSTONES GUMMIES OMEGA-3 DHA PO) Take by mouth.   Yes [provider]  dicyclomine (BENTYL) 10 MG capsule Take 1 capsule (10 mg total) by mouth 3 (three) times daily as needed for up to 5 days for spasms. 05/31/19 08/12/20  Shaune Pollack, MD  sucralfate (CARAFATE) 1 g tablet Take 1 tablet (1 g total) by mouth 4 (four) times daily. 02/02/20 08/12/20  Sharman Cheek, MD     Prenatal care site: [facility]  Social History: She  reports that she has never smoked. She has never used smokeless tobacco. She reports that she does not currently use alcohol. She reports that she does not currently use drugs after having used the following drugs: Marijuana.  Family History: family history includes ALS in her paternal aunt; Asthma in her brother and mother; Glaucoma in her paternal grandmother; Hypertension in her maternal grandmother; Liver disease in her mother; Migraines in her brother and mother; Other in her father and maternal grandmother.    Review of Systems: A full review of systems was performed and negative except as noted in the HPI.     Physical Exam:  Vital Signs: BP 109/63   Pulse 89   Temp 98.8 F (37.1 C)   Resp 16   Ht 5\' 1"  (1.549 m)   Wt 63 kg   LMP 07/06/2022 (Approximate)   BMI 26.24 kg/m  General: no acute distress.  HEENT: normocephalic, atraumatic Heart: regular rate & rhythm.  No murmurs/rubs/gallops Lungs: clear to auscultation bilaterally, normal respiratory effort Abdomen: soft, gravid, non-tender;  EFW: 5lbs Pelvic:   External: Normal external female genitalia  Cervix: Dilation: 1 / Effacement (%): 60 / Station: -2    Extremities: non-tender, symmetric, No edema bilaterally.   Neurologic: Alert & oriented x 3.    Results for orders placed or performed during the hospital encounter of 04/03/23 (from the past 24 hour(s))  Type and screen     Status: None   Collection Time: 04/03/23 12:44 AM  Result Value Ref Range   ABO/RH(D) O POS    Antibody Screen NEG    Sample Expiration      04/06/2023,2359 Performed at [facility], [address] [place], [place] [address]   CBC     Status: Abnormal   Collection Time: 04/03/23 12:45 AM  Result Value Ref Range   WBC 15.2 (H) 4.0 - 10.5 K/uL  RBC 3.92 3.87 - 5.11 MIL/uL   Hemoglobin 11.4 (L) 12.0 - 15.0 g/dL   HCT 16.1 (L) 09.6 - 04.5 %   MCV 86.2 80.0 - 100.0 fL   MCH 29.1 26.0 - 34.0 pg   MCHC 33.7 30.0 - 36.0 g/dL   RDW 40.9 81.1 - 91.4 %   Platelets 243 150 - 400 K/uL   nRBC 0.0 0.0 - 0.2 %  Urine Drug Screen, Qualitative ([facility] only)     Status: Abnormal   Collection Time: 04/03/23 12:45 AM  Result Value Ref Range   Tricyclic, Ur Screen NONE DETECTED NONE DETECTED   Amphetamines, Ur Screen NONE DETECTED NONE DETECTED   MDMA (Ecstasy)Ur Screen NONE DETECTED NONE DETECTED   Cocaine Metabolite,Ur [place] NONE DETECTED NONE DETECTED   Opiate, Ur Screen NONE DETECTED NONE DETECTED   Phencyclidine (PCP) Ur S NONE DETECTED NONE  DETECTED   Cannabinoid 50 Ng, Ur [place] POSITIVE (A) NONE DETECTED   Barbiturates, Ur Screen NONE DETECTED NONE DETECTED   Benzodiazepine, Ur Scrn NONE DETECTED NONE DETECTED   Methadone Scn, Ur NONE DETECTED NONE DETECTED    Pertinent Results:  Prenatal Labs: Blood type/Rh O +  Antibody screen neg  Rubella Immune  Varicella NON Immune  RPR NR  HBsAg Neg  HIV NR  GC neg  Chlamydia neg  Genetic screening negative  1 hour GTT 114  3 hour GTT   GBS Negative    NWG:NFAOZHYQ 110, moderate variability, pos accel, neg decel  TOCO:irregular  SVE:  Dilation: 1 / Effacement (%): 60 / Station: -2    Cephalic by [employer]  [place] MFM OB DETAIL +14 WK  Result Date: 03/30/2023 ----------------------------------------------------------------------  OBSTETRICS REPORT                       (Signed Final 03/30/2023 01:04 pm) ---------------------------------------------------------------------- Patient Info  ID #:       [id number]                          D.O.B.:  Jun 30, 2006 (16 yrs)  Name:       Whitney Rios                    Visit Date: 03/30/2023 08:53 am ---------------------------------------------------------------------- Performed By  Attending:        Noralee Space MD        Ref. Address:     [address]                                                             [place]  [address]  Performed By:     Isac Sarna        [facility]                                  [facility]  Referred By:      Salomon Mast ---------------------------------------------------------------------- Orders  #  Description                           Code        Ordered By  1  [place] MFM OB DETAIL +14 WK                76811.01    [facility] FREE  2  [place] MFM UA CORD DOPPLER                76820.02    [facility] FREE  3  [place] MFM FETAL BPP                      66440.3     [facility] FREE     W/NONSTRESS ----------------------------------------------------------------------  #  Order #                     Accession #                Episode #  1  [id number]                   [id number]                 [id number]  2  [id number]                   [id number]                 [id number]  3  [id number]                   [id number]                 [id number] ---------------------------------------------------------------------- Indications  Maternal care for known or suspected poor      O36.5930  fetal growth, third trimester, not applicable or  unspecified IUGR  Tobacco use complicating pregnancy, third      O99.333  trimester  Marijuana Use  Teen pregnancy  O75.89  Maternit neg Female  [redacted] weeks gestation of pregnancy                Z3A.36  Encounter for antenatal screening for          Z36.3  malformations ---------------------------------------------------------------------- Fetal Evaluation  Num Of Fetuses:         1  Fetal Heart Rate(bpm):  118  Cardiac Activity:       Observed  Presentation:           Cephalic  Placenta:               Posterior Fundal  P. Cord Insertion:      Not well visualized  Amniotic Fluid  AFI FV:      Within normal limits  AFI Sum(cm)     %Tile       Largest Pocket(cm)  14.47           53          4.13  RUQ(cm)       RLQ(cm)       LUQ(cm)        LLQ(cm)  2.69          3.95          4.13           3.7 ---------------------------------------------------------------------- Biophysical Evaluation  Amniotic F.V:   Pocket => 2 cm             F. Tone:        Observed  F. Movement:    Observed                   N.S.T:          Reactive  F. Breathing:   Observed                   Score:          10/10 ---------------------------------------------------------------------- Biometry  BPD:      81.2  mm     G.  Age:  32w 4d        < 1  %    CI:        73.13   %    70 - 86                                                          FL/HC:      20.0   %    20.8 - 22.6  HC:      301.8  mm     G. Age:  33w 4d        < 1  %    HC/AC:      1.09        0.92 - 1.05  AC:      275.8  mm     G. Age:  31w 5d        < 1  %    FL/BPD:     74.4   %    71 - 87  FL:       60.4  mm     G. Age:  31w 3d        < 1  %    FL/AC:  21.9   %    20 - 24  HUM:      51.1  mm     G. Age:  30w 0d        < 5  %  CER:      48.3  mm     G. Age:  36w 2d         38  %  LV:        4.6  mm  CM:        8.7  mm  Est. FW:    1848  gm      4 lb 1 oz    < 1  % ---------------------------------------------------------------------- OB History  Gravidity:    2         Term:   1  Living:       1 ---------------------------------------------------------------------- Gestational Age  LMP:           38w 1d        Date:  07/06/22                  EDD:   04/12/23  U/S Today:     32w 2d                                        EDD:   05/23/23  Best:          36w 4d     Det. By:  Marcella Dubs         EDD:   04/23/23                                      (09/13/22) ---------------------------------------------------------------------- Anatomy  Cranium:               Appears normal         Aortic Arch:            Appears normal  Cavum:                 Appears normal         Ductal Arch:            Not well visualized  Ventricles:            Appears normal         Diaphragm:              Appears normal  Choroid Plexus:        Appears normal         Stomach:                Appears normal, left                                                                        sided  Cerebellum:            Appears normal         Abdomen:                Appears normal  Posterior Fossa:       Appears normal         Abdominal Wall:         Not well visualized  Nuchal Fold:           Not applicable (>20    Cord Vessels:           Appears normal ([redacted]                         wks GA)                                         vessel cord)  Face:                  Orbits nl; profile not Kidneys:                Appear normal                         well visualized  Lips:                  Appears normal         Bladder:                Appears normal  Thoracic:              Appears normal         Spine:                  Limited views                                                                        appear normal  Heart:                 Appears normal         Upper Extremities:      LUE visualized  RVOT:                  Appears normal         Lower Extremities:      Appears normal  LVOT:                  Appears normal  Other:  Fetus appears to be female.feet,  nasal bone, lenses,IVC and 3VV          visualized. Technically difficult due to advanced gestational age. ---------------------------------------------------------------------- Doppler - Fetal Vessels  Umbilical Artery   S/D     %tile      RI    %tile      PI    %tile     PSV    ADFV    RDFV                                                     (cm/s)   3.71  97    0.73       97    1.14       95     55.1      No      No ---------------------------------------------------------------------- Cervix Uterus Adnexa  Cervix  Not visualized (advanced GA >24wks)  Uterus  No abnormality visualized.  Right Ovary  Size(cm)       1.6  x   1.52   x  0.94      Vol(ml): 1.2  Within normal limits.  Left Ovary  Not visualized.  Cul De Sac  No free fluid seen.  Adnexa  No abnormality visualized ---------------------------------------------------------------------- Impression  G2 P1001.  Patient is here for a second opinion ultrasound.  At your office ultrasound performed yesterday, severe fetal  growth restriction was detected.  Her prenatal course has otherwise been uneventful.  She  does not have gestational diabetes.  On cell-free fetal DNA  screening, the risks of fetal aneuploidies are not increased.  Her pregnancy is well dated by 8-week ultrasound.  Obstetrical history  is significant for a term vaginal delivery in  2022 of a female infant weighing 6 pounds and 7 ounces at  birth.  On today's ultrasound, the estimated fetal weight and the  abdominal circumference measurements are at the 1st  percentile.  Head circumference measurement is at between -  2 and -3 SD (normal).  Amniotic fluid is normal and good fetal  activity seen.  Fetal anatomical survey appears normal but  limited by advanced gestational age.  Umbilical artery Doppler showed normal forward diastolic  flow.  NST is reactive.  BPP 10/10.  I explained the finding of severe fetal growth restriction and  the most likely cause is placental insufficiency.  Severe fetal  growth restriction is associated with increased risk of  perinatal mortality and morbidity.  I agree with your plan of induction of labor at [redacted] weeks  gestation. ----------------------------------------------------------------------                 Noralee Space, MD Electronically Signed Final Report   03/30/2023 01:04 pm ----------------------------------------------------------------------  [place] MFM UA CORD DOPPLER  Result Date: 03/30/2023 ----------------------------------------------------------------------  OBSTETRICS REPORT                       (Signed Final 03/30/2023 01:04 pm) ---------------------------------------------------------------------- Patient Info  ID #:       [id number]                          D.O.B.:  2006/02/24 (16 yrs)  Name:       Whitney Rios                    Visit Date: 03/30/2023 08:53 am ---------------------------------------------------------------------- Performed By  Attending:        Noralee Space MD        Ref. Address:     [address]  [place]                                                             [address]  Performed By:     Isac Sarna        Location[facility]                                  [facility]  Referred By:      Salomon Mast ---------------------------------------------------------------------- Orders  #  Description                           Code        Ordered By  1  [place] MFM OB DETAIL +14 WK               76811.01    [facility] FREE  2  [place] MFM UA CORD DOPPLER                76820.02    [facility] FREE  3  [place] MFM FETAL BPP                      91478.2     [facility] FREE     W/NONSTRESS ----------------------------------------------------------------------  #  Order #                     Accession #                Episode #  1  [id number]                   [id number]                 [id number]  2  [id number]                   [id number]                 [id number]  3  [id number]                   [id number]                 [id number] ---------------------------------------------------------------------- Indications  Maternal care for known or suspected poor  O36.5930  fetal growth, third trimester, not applicable or  unspecified IUGR  Tobacco use complicating pregnancy, third      O99.333  trimester  Marijuana Use  Teen pregnancy                                 O70.89  Maternit neg Female  [redacted] weeks gestation of pregnancy                Z3A.36  Encounter for antenatal screening for          Z36.3  malformations ---------------------------------------------------------------------- Fetal Evaluation  Num Of Fetuses:         1  Fetal Heart Rate(bpm):  118  Cardiac Activity:       Observed  Presentation:           Cephalic  Placenta:               Posterior Fundal  P. Cord Insertion:      Not well visualized  Amniotic Fluid  AFI FV:      Within normal limits  AFI Sum(cm)     %Tile       Largest Pocket(cm)  14.47           53          4.13  RUQ(cm)       RLQ(cm)       LUQ(cm)        LLQ(cm)  2.69          3.95           4.13           3.7 ---------------------------------------------------------------------- Biophysical Evaluation  Amniotic F.V:   Pocket => 2 cm             F. Tone:        Observed  F. Movement:    Observed                   N.S.T:          Reactive  F. Breathing:   Observed                   Score:          10/10 ---------------------------------------------------------------------- Biometry  BPD:      81.2  mm     G. Age:  32w 4d        < 1  %    CI:        73.13   %    70 - 86                                                          FL/HC:      20.0   %    20.8 - 22.6  HC:      301.8  mm     G. Age:  33w 4d        < 1  %    HC/AC:      1.09        0.92 - 1.05  AC:      275.8  mm     G. Age:  31w 5d        <  1  %    FL/BPD:     74.4   %    71 - 87  FL:       60.4  mm     G. Age:  31w 3d        < 1  %    FL/AC:      21.9   %    20 - 24  HUM:      51.1  mm     G. Age:  30w 0d        < 5  %  CER:      48.3  mm     G. Age:  36w 2d         38  %  LV:        4.6  mm  CM:        8.7  mm  Est. FW:    1848  gm      4 lb 1 oz    < 1  % ---------------------------------------------------------------------- OB History  Gravidity:    2         Term:   1  Living:       1 ---------------------------------------------------------------------- Gestational Age  LMP:           38w 1d        Date:  07/06/22                  EDD:   04/12/23  U/S Today:     32w 2d                                        EDD:   05/23/23  Best:          36w 4d     Det. By:  Marcella Dubs         EDD:   04/23/23                                      (09/13/22) ---------------------------------------------------------------------- Anatomy  Cranium:               Appears normal         Aortic Arch:            Appears normal  Cavum:                 Appears normal         Ductal Arch:            Not well visualized  Ventricles:            Appears normal         Diaphragm:              Appears normal  Choroid Plexus:        Appears normal         Stomach:                 Appears normal, left  sided  Cerebellum:            Appears normal         Abdomen:                Appears normal  Posterior Fossa:       Appears normal         Abdominal Wall:         Not well visualized  Nuchal Fold:           Not applicable (>20    Cord Vessels:           Appears normal ([redacted]                         wks GA)                                        vessel cord)  Face:                  Orbits nl; profile not Kidneys:                Appear normal                         well visualized  Lips:                  Appears normal         Bladder:                Appears normal  Thoracic:              Appears normal         Spine:                  Limited views                                                                        appear normal  Heart:                 Appears normal         Upper Extremities:      LUE visualized  RVOT:                  Appears normal         Lower Extremities:      Appears normal  LVOT:                  Appears normal  Other:  Fetus appears to be female.feet,  nasal bone, lenses,IVC and 3VV          visualized. Technically difficult due to advanced gestational age. ---------------------------------------------------------------------- Doppler - Fetal Vessels  Umbilical Artery   S/D     %tile      RI    %tile      PI    %tile     PSV    ADFV    RDFV                                                     (  cm/s)   3.71       97    0.73       97    1.14       95     55.1      No      No ---------------------------------------------------------------------- Cervix Uterus Adnexa  Cervix  Not visualized (advanced GA >24wks)  Uterus  No abnormality visualized.  Right Ovary  Size(cm)       1.6  x   1.52   x  0.94      Vol(ml): 1.2  Within normal limits.  Left Ovary  Not visualized.  Cul De Sac  No free fluid seen.  Adnexa  No abnormality visualized  ---------------------------------------------------------------------- Impression  G2 P1001.  Patient is here for a second opinion ultrasound.  At your office ultrasound performed yesterday, severe fetal  growth restriction was detected.  Her prenatal course has otherwise been uneventful.  She  does not have gestational diabetes.  On cell-free fetal DNA  screening, the risks of fetal aneuploidies are not increased.  Her pregnancy is well dated by 8-week ultrasound.  Obstetrical history is significant for a term vaginal delivery in  2022 of a female infant weighing 6 pounds and 7 ounces at  birth.  On today's ultrasound, the estimated fetal weight and the  abdominal circumference measurements are at the 1st  percentile.  Head circumference measurement is at between -  2 and -3 SD (normal).  Amniotic fluid is normal and good fetal  activity seen.  Fetal anatomical survey appears normal but  limited by advanced gestational age.  Umbilical artery Doppler showed normal forward diastolic  flow.  NST is reactive.  BPP 10/10.  I explained the finding of severe fetal growth restriction and  the most likely cause is placental insufficiency.  Severe fetal  growth restriction is associated with increased risk of  perinatal mortality and morbidity.  I agree with your plan of induction of labor at [redacted] weeks  gestation. ----------------------------------------------------------------------                 Noralee Space, MD Electronically Signed Final Report   03/30/2023 01:04 pm ----------------------------------------------------------------------  [place] MFM FETAL BPP W/NONSTRESS  Result Date: 03/30/2023 ----------------------------------------------------------------------  OBSTETRICS REPORT                       (Signed Final 03/30/2023 01:04 pm) ---------------------------------------------------------------------- Patient Info  ID #:       [id number]                          D.O.B.:  04-Feb-2006 (16 yrs)  Name:       Whitney Rios                     Visit Date: 03/30/2023 08:53 am ---------------------------------------------------------------------- Performed By  Attending:        Noralee Space MD        Ref. Address:     [address]  [place]                                                             [address]  Performed By:     Isac Sarna        Location[facility]                                  [facility]  Referred By:      Salomon Mast ---------------------------------------------------------------------- Orders  #  Description                           Code        Ordered By  1  [place] MFM OB DETAIL +14 WK               76811.01    [facility] FREE  2  [place] MFM UA CORD DOPPLER                76820.02    [facility] FREE  3  [place] MFM FETAL BPP                      60454.0     [facility] FREE     W/NONSTRESS ----------------------------------------------------------------------  #  Order #                     Accession #                Episode #  1  [id number]                   [id number]                 [id number]  2  [id number]                   [id number]                 [id number]  3  [id number]                   [id number]                 [id number] ---------------------------------------------------------------------- Indications  Maternal care for known or suspected poor  O36.5930  fetal growth, third trimester, not applicable or  unspecified IUGR  Tobacco use complicating pregnancy, third      O99.333  trimester  Marijuana Use  Teen pregnancy                                 O60.89  Maternit neg Female  [redacted] weeks gestation of pregnancy                Z3A.36  Encounter for antenatal screening for          Z36.3  malformations  ---------------------------------------------------------------------- Fetal Evaluation  Num Of Fetuses:         1  Fetal Heart Rate(bpm):  118  Cardiac Activity:       Observed  Presentation:           Cephalic  Placenta:               Posterior Fundal  P. Cord Insertion:      Not well visualized  Amniotic Fluid  AFI FV:      Within normal limits  AFI Sum(cm)     %Tile       Largest Pocket(cm)  14.47           53          4.13  RUQ(cm)       RLQ(cm)       LUQ(cm)        LLQ(cm)  2.69          3.95          4.13           3.7 ---------------------------------------------------------------------- Biophysical Evaluation  Amniotic F.V:   Pocket => 2 cm             F. Tone:        Observed  F. Movement:    Observed                   N.S.T:          Reactive  F. Breathing:   Observed                   Score:          10/10 ---------------------------------------------------------------------- Biometry  BPD:      81.2  mm     G. Age:  32w 4d        < 1  %    CI:        73.13   %    70 - 86                                                          FL/HC:      20.0   %    20.8 - 22.6  HC:      301.8  mm     G. Age:  33w 4d        < 1  %    HC/AC:      1.09        0.92 - 1.05  AC:      275.8  mm     G. Age:  31w 5d        <  1  %    FL/BPD:     74.4   %    71 - 87  FL:       60.4  mm     G. Age:  31w 3d        < 1  %    FL/AC:      21.9   %    20 - 24  HUM:      51.1  mm     G. Age:  30w 0d        < 5  %  CER:      48.3  mm     G. Age:  36w 2d         38  %  LV:        4.6  mm  CM:        8.7  mm  Est. FW:    1848  gm      4 lb 1 oz    < 1  % ---------------------------------------------------------------------- OB History  Gravidity:    2         Term:   1  Living:       1 ---------------------------------------------------------------------- Gestational Age  LMP:           38w 1d        Date:  07/06/22                  EDD:   04/12/23  U/S Today:     32w 2d                                        EDD:   05/23/23  Best:           36w 4d     Det. By:  Marcella Dubs         EDD:   04/23/23                                      (09/13/22) ---------------------------------------------------------------------- Anatomy  Cranium:               Appears normal         Aortic Arch:            Appears normal  Cavum:                 Appears normal         Ductal Arch:            Not well visualized  Ventricles:            Appears normal         Diaphragm:              Appears normal  Choroid Plexus:        Appears normal         Stomach:                Appears normal, left  sided  Cerebellum:            Appears normal         Abdomen:                Appears normal  Posterior Fossa:       Appears normal         Abdominal Wall:         Not well visualized  Nuchal Fold:           Not applicable (>20    Cord Vessels:           Appears normal ([redacted]                         wks GA)                                        vessel cord)  Face:                  Orbits nl; profile not Kidneys:                Appear normal                         well visualized  Lips:                  Appears normal         Bladder:                Appears normal  Thoracic:              Appears normal         Spine:                  Limited views                                                                        appear normal  Heart:                 Appears normal         Upper Extremities:      LUE visualized  RVOT:                  Appears normal         Lower Extremities:      Appears normal  LVOT:                  Appears normal  Other:  Fetus appears to be female.feet,  nasal bone, lenses,IVC and 3VV          visualized. Technically difficult due to advanced gestational age. ---------------------------------------------------------------------- Doppler - Fetal Vessels  Umbilical Artery   S/D     %tile      RI    %tile      PI    %tile     PSV    ADFV    RDFV                                                      (  cm/s)   3.71       97    0.73       97    1.14       95     55.1      No      No ---------------------------------------------------------------------- Cervix Uterus Adnexa  Cervix  Not visualized (advanced GA >24wks)  Uterus  No abnormality visualized.  Right Ovary  Size(cm)       1.6  x   1.52   x  0.94      Vol(ml): 1.2  Within normal limits.  Left Ovary  Not visualized.  Cul De Sac  No free fluid seen.  Adnexa  No abnormality visualized ---------------------------------------------------------------------- Impression  G2 P1001.  Patient is here for a second opinion ultrasound.  At your office ultrasound performed yesterday, severe fetal  growth restriction was detected.  Her prenatal course has otherwise been uneventful.  She  does not have gestational diabetes.  On cell-free fetal DNA  screening, the risks of fetal aneuploidies are not increased.  Her pregnancy is well dated by 8-week ultrasound.  Obstetrical history is significant for a term vaginal delivery in  2022 of a female infant weighing 6 pounds and 7 ounces at  birth.  On today's ultrasound, the estimated fetal weight and the  abdominal circumference measurements are at the 1st  percentile.  Head circumference measurement is at between -  2 and -3 SD (normal).  Amniotic fluid is normal and good fetal  activity seen.  Fetal anatomical survey appears normal but  limited by advanced gestational age.  Umbilical artery Doppler showed normal forward diastolic  flow.  NST is reactive.  BPP 10/10.  I explained the finding of severe fetal growth restriction and  the most likely cause is placental insufficiency.  Severe fetal  growth restriction is associated with increased risk of  perinatal mortality and morbidity.  I agree with your plan of induction of labor at [redacted] weeks  gestation. ----------------------------------------------------------------------                 Noralee Space, MD Electronically Signed Final Report   03/30/2023 01:04 pm  ----------------------------------------------------------------------  US OB Follow Up  Result Date: 03/29/2023 CLINICAL DATA:  Size less than dates EXAM: OBSTETRIC 14+ WK ULTRASOUND FOLLOW-UP COMPARISON:  12/06/2022 FINDINGS: Number of Fetuses: 1 Heart Rate:  140 bpm Movement: Seen Presentation: Cephalic Previa: No Placental Location: Posterior. There is increased echogenicity in placenta suggesting grade 3 placenta. Amniotic Fluid (Subjective): Normal Amniotic Fluid (Objective): Vertical pocket 3.4cm AFI 9.5 cm (5%ile= 7.7 cm, 95%= 24.9 cm for 36 wks) FETAL BIOMETRY BPD:  7.95cm 31w 6d, less than 0.5 percentile HC:    29.68cm 32w 6d, less than 0.5 percentile AC:    28.87cm 32w 6d, 0.8 percentile FL:    6.17cm 32w 0d, less than 0.5 percentile Current Mean GA: 32w 2d [place] EDC: 05/22/2023 Assigned GA: 36w 3d Assigned EDC: 04/23/2023 Estimated Fetal Weight:  1994g 0.7%ile FETAL ANATOMY Lateral Ventricles: Appears normal Thalami/CSP: Appears normal Posterior Fossa: Previously seen Nuchal Region: Not visualized Upper Lip: Previously seen Spine: Appears normal 4 Chamber Heart on Left: Appears normal LVOT: Previously seen RVOT: Previously seen Stomach on Left: Appears normal 3 Vessel Cord: Previously seen Cord Insertion site: Previously seen Kidneys: Appears normal Bladder: Appears normal Extremities: Previously seen Sex: Female Technical Limitations: None Maternal Findings: Cervix:  Cervix is closed measuring 4.3 cm IMPRESSION: There is single live intrauterine pregnancy sonographically estimated gestational age is  32 weeks 3 days. Estimated gestational age in the current study is approximately 4 weeks less in comparison with the assigned gestational age. All fetal measurements and estimated fetal weight are less than 0.8 percentile. Findings suggest possible IUGR. These results will be called to the ordering clinician or representative by the Radiologist Assistant, and communication documented in the PACS or [employer]. Electronically Signed   By: Ernie Avena M.D.   On: 03/29/2023 14:09    Assessment:  Whitney Rios is a 17 y.o. G44P1001 female at [redacted]w[redacted]d with IOL secondary to severe IUGR.   Plan:  Admit to Labor & Delivery Cytotec placed at 0100 and 0500, consider cook;s, AROM when appropriate.  CBC, T&S, , IVF GBS  negative, membranes intact  Consents obtained. Continuous efm/toco Pain management: recently received Fentanyl, planning epidural.   Dr Logan Bores aware of admission and plan  ----- Carie Caddy, CNM   [place]

## 2023-04-03 NOTE — Progress Notes (Signed)
Pt requested a stool softener and colace 100mg  was given at 2122. Will adjust time and update day shift nurse about dose given tonight.

## 2023-04-03 NOTE — Anesthesia Preprocedure Evaluation (Signed)
Anesthesia Evaluation  Patient identified by MRN, date of birth, ID band Patient awake    Reviewed: Allergy & Precautions, H&P , NPO status , Patient's Chart, lab work & pertinent test results, reviewed documented beta blocker date and time   History of Anesthesia Complications Negative for: history of anesthetic complications  Airway Mallampati: II  TM Distance: >3 FB Neck ROM: full    Dental  (+) Dental Advidsory Given, Teeth Intact   Pulmonary neg shortness of breath, asthma , neg sleep apnea, neg COPD, neg recent URI   Pulmonary exam normal breath sounds clear to auscultation       Cardiovascular Exercise Tolerance: Good negative cardio ROS Normal cardiovascular exam Rhythm:regular Rate:Normal     Neuro/Psych negative neurological ROS  negative psych ROS   GI/Hepatic Neg liver ROS,GERD  ,,  Endo/Other  negative endocrine ROS    Renal/GU negative Renal ROS  negative genitourinary   Musculoskeletal   Abdominal   Peds  Hematology negative hematology ROS (+)   Anesthesia Other Findings Past Medical History: No date: Asthma No date: GERD (gastroesophageal reflux disease)   Reproductive/Obstetrics negative OB ROS                             Anesthesia Physical Anesthesia Plan  ASA: 2  Anesthesia Plan: Epidural   Post-op Pain Management:    Induction:   PONV Risk Score and Plan:   Airway Management Planned:   Additional Equipment:   Intra-op Plan:   Post-operative Plan:   Informed Consent: I have reviewed the patients History and Physical, chart, labs and discussed the procedure including the risks, benefits and alternatives for the proposed anesthesia with the patient or authorized representative who has indicated his/her understanding and acceptance.     Dental Advisory Given  Plan Discussed with: Anesthesiologist, CRNA and Surgeon  Anesthesia Plan Comments:         Anesthesia Quick Evaluation

## 2023-04-03 NOTE — Discharge Instructions (Signed)
Discharge Instructions:   If there are any new medications, they have been ordered and will be available for pickup at the listed pharmacy on your way home from the hospital.   Call office if you have any of the following: headache, visual changes, fever >101.0 F, chills, shortness of breath, breast concerns, excessive vaginal bleeding, incision drainage or problems, leg pain or redness, depression or any other concerns. If you have vaginal discharge with an odor, let your doctor know.   It is normal to bleed for up to 6 weeks. You should not soak through more than 1 pad in 1 hour. If you have a blood clot larger than your fist with continued bleeding, call your doctor.   Activity: Do not lift > 10 lbs for 6 weeks (do not lift anything heavier than your baby). No intercourse, tampons, swimming pools, hot tubs, baths (only showers) for 6 weeks.  No driving for 1-2 weeks. Continue prenatal vitamin, especially if breastfeeding. Increase calories and fluids (water) while breastfeeding.   Your milk will come in, in the next couple of days (right now it is colostrum). You may have a slight fever when your milk comes in, but it should go away on its own.  If it does not, and rises above 101 F please call the doctor. You will also feel achy and your breasts will be firm. They will also start to leak. If you are breastfeeding, continue as you have been and you can pump/express milk for comfort.   If you have too much milk, your breasts can become engorged, which could lead to mastitis. This is an infection of the milk ducts. It can be very painful and you will need to notify your doctor to obtain a prescription for antibiotics. You can also treat it with a shower or hot/cold compress.   For concerns about your baby, please call your pediatrician.  For breastfeeding concerns, the lactation consultant can be reached at [phone].   Postpartum blues (feelings of happy one minute and sad another minute)  are normal for the first few weeks but if it gets worse let your doctor know.   Congratulations! We enjoyed caring for you and your new bundle of joy!  

## 2023-04-03 NOTE — Discharge Summary (Signed)
Postpartum Discharge Summary  Date of Service updated 04/05/23       Patient Name: Whitney Rios DOB: Oct 08, 2006 MRN: [id number]  Date of admission: 04/03/2023 Delivery date:04/03/2023  Delivering provider: Mirna Mires  Date of discharge: 04/05/2023  Admitting diagnosis: Encounter for induction of labor [Z34.90] Intrauterine pregnancy: [redacted]w[redacted]d     Secondary diagnosis:  Principal Problem:   Encounter for induction of labor Active Problems:   Postpartum care following vaginal delivery  Additional problems: none    Discharge diagnosis: Term Pregnancy Delivered                                              Post partum procedures: none Augmentation: Pitocin Complications: None  Hospital course: Induction of Labor With Vaginal Delivery   17 y.o. yo G2P1001 at [redacted]w[redacted]d was admitted to the hospital 04/03/2023 for induction of labor.  Indication for induction:  IUGR .  Patient had an labor course complicated by a low FHR baseline. Membrane Rupture Time/Date: 3:02 PM ,04/03/2023   Delivery Method:Vaginal, Spontaneous  Episiotomy: None  Lacerations:  None  Details of delivery can be found in separate delivery note.  Patient had a postpartum course without complications.   Pt. Is eating, hydrating, and voiding regularly without difficulty. Has yet to have BM. She is strictly bottle feeding with 24cal formula. Reports mild/moderate vaginal bleeding, denies passing large blood clots. Has had cramping abdomen pain relieved with tylenol/ibuprofen. Unsure of plans for contraception, states she knows her options and does not have any questions about them, declines depo or Rx for mini pill before discharge. Denies anxiety/depression symptoms. Endorses good support from partner and family.     Newborn Data: Birth date:04/03/2023  Birth time:3:18 PM  Gender:Female  Living status:Living  Apgars:8 ,9  Weight:2080 g   Magnesium Sulfate received: No BMZ received:  No Rhophylac:N/A MMR:No T-DaP:Given prenatally Flu: No Transfusion:No  Physical exam  Vitals:   04/04/23 0819 04/04/23 1619 04/04/23 2315 04/05/23 0728  BP: 113/64 110/60 117/73 (!) 107/41  Pulse: (!) 56 82 89 65  Resp:  20 18 18   Temp:  98.1 F (36.7 C) 98.7 F (37.1 C) 98.2 F (36.8 C)  TempSrc:  Oral Oral Oral  SpO2: 99% 98% 99% 100%  Weight:      Height:       General: alert, cooperative, and no distress Lochia: appropriate Uterine Fundus: firm Incision: N/A DVT Evaluation: No evidence of DVT seen on physical exam. No cords or calf tenderness. No significant calf/ankle edema. Labs: Lab Results  Component Value Date   WBC 14.2 (H) 04/04/2023   HGB 9.4 (L) 04/04/2023   HCT 29.1 (L) 04/04/2023   MCV 88.7 04/04/2023   PLT 195 04/04/2023      Latest Ref Rng & Units 07/08/2022    7:58 PM  CMP  Glucose 70 - 99 mg/dL 90   BUN 4 - 18 mg/dL 12   Creatinine 4.09 - 1.00 mg/dL 8.11   Sodium 914 - 782 mmol/L 138   Potassium 3.5 - 5.1 mmol/L 3.5   Chloride 98 - 111 mmol/L 104   CO2 22 - 32 mmol/L 18   Calcium 8.9 - 10.3 mg/dL 95.6   Total Protein 6.5 - 8.1 g/dL 8.2   Total Bilirubin 0.3 - 1.2 mg/dL 1.6   Alkaline Phos 47 - 119 U/L 63  AST 15 - 41 U/L 17   ALT 0 - 44 U/L 18    Edinburgh Score:    04/04/2023    8:00 AM  Edinburgh Postnatal Depression Scale Screening Tool  I have been able to laugh and see the funny side of things. 0  I have looked forward with enjoyment to things. 0  I have blamed myself unnecessarily when things went wrong. 1  I have been anxious or worried for no good reason. 2  I have felt scared or panicky for no good reason. 1  Things have been getting on top of me. 1  I have been so unhappy that I have had difficulty sleeping. 0  I have felt sad or miserable. 1  I have been so unhappy that I have been crying. 1  The thought of harming myself has occurred to me. 0  Edinburgh Postnatal Depression Scale Total 7      After visit meds:   Allergies as of 04/05/2023   No Known Allergies      Medication List     STOP taking these medications    famotidine 20 MG tablet Commonly known as: PEPCID   FLINSTONES GUMMIES OMEGA-3 DHA PO       TAKE these medications    ferrous sulfate 325 (65 FE) MG EC tablet Take 1 tablet (325 mg total) by mouth daily with breakfast.         Discharge home in stable condition Infant Feeding: Bottle Infant Disposition:home with mother Discharge instruction: per After Visit Summary and Postpartum booklet. Activity: Advance as tolerated. Pelvic rest for 6 weeks.  Diet: routine diet Anticipated Birth Control: Unsure Postpartum Appointment:6 weeks Additional Postpartum F/U: Postpartum Depression checkup Future Appointments: Future Appointments  Date Time Provider Department Center  04/11/2023  2:00 PM [facility] NURSE [facility] None   Follow up Visit:  Follow-up Information     Mirna Mires, CNM. Schedule an appointment as soon as possible for a visit in 6 week(s).   Specialties: Obstetrics, Gynecology Why: Please call the office and set up two appointments: the first in 2 weeks, whihc will be a phone visit. The second, and very important, is the physical exam and birth control appoitnment in 6 weeks. Contact information: [address] [place] [address] [phone]                     04/05/2023 Raeford Razor, CNM 10:59 AM

## 2023-04-03 NOTE — Progress Notes (Signed)
Whitney Rios is a 17 y.o. G2P1001 who continues with induction of labor.She has now received an epidural and has also received one dose of Terbutaline   Subjective: Very comfortable  now as epidural is placed.   Objective: BP (!) 123/98   Pulse 93   Temp 99.5 F (37.5 C) (Oral)   Resp 18   Ht 5\' 1"  (1.549 m)   Wt 63 kg   LMP 07/06/2022 (Approximate)   SpO2 99%   BMI 26.24 kg/m  No intake/output data recorded. No intake/output data recorded.  FHT:  FHR: 140 bpm, variability: moderate,  accelerations:  Present,  decelerations:  Absent UC:   regular, every 2.5-3 minutes SVE:   Dilation: 3 Effacement (%): 70 Station: -2, -3 Exam by:: Whitney Rios CNM  Labs: Lab Results  Component Value Date   WBC 15.2 (H) 04/03/2023   HGB 11.4 (L) 04/03/2023   HCT 33.8 (L) 04/03/2023   MCV 86.2 04/03/2023   PLT 243 04/03/2023    Assessment / Plan: Induction of labor due to IUGR,  progressing well on pitocin    Fetal Wellbeing:  Category I Pain Control:  Epidural I/D:  n/a Anticipated MOD:  NSVD  Will recheck in 1-2 hours after she has rested. Plan to start low dose pitocin  Whitney Rios, CNM 04/03/2023, 9:50 AM

## 2023-04-04 LAB — CBC
HCT: 29.1 % — ABNORMAL LOW (ref 36.0–46.0)
Hemoglobin: 9.4 g/dL — ABNORMAL LOW (ref 12.0–15.0)
MCH: 28.7 pg (ref 26.0–34.0)
MCHC: 32.3 g/dL (ref 30.0–36.0)
MCV: 88.7 fL (ref 80.0–100.0)
Platelets: 195 10*3/uL (ref 150–400)
RBC: 3.28 MIL/uL — ABNORMAL LOW (ref 3.87–5.11)
RDW: 13.2 % (ref 11.5–15.5)
WBC: 14.2 10*3/uL — ABNORMAL HIGH (ref 4.0–10.5)
nRBC: 0 % (ref 0.0–0.2)

## 2023-04-04 LAB — TYPE AND SCREEN: ABO/RH(D): O POS

## 2023-04-04 NOTE — Anesthesia Postprocedure Evaluation (Signed)
Anesthesia Post Note  Patient: Whitney Rios  Procedure(s) Performed: AN AD HOC LABOR EPIDURAL  Patient location during evaluation: Mother Baby Anesthesia Type: Epidural Level of consciousness: awake, awake and alert and oriented Pain management: pain level controlled Vital Signs Assessment: post-procedure vital signs reviewed and stable Respiratory status: spontaneous breathing, nonlabored ventilation and respiratory function stable Cardiovascular status: blood pressure returned to baseline and stable Postop Assessment: no headache and no backache Anesthetic complications: no   No notable events documented.   Last Vitals:  Vitals:   04/04/23 0051 04/04/23 0432  BP: 110/71 106/64  Pulse: 74 75  Resp: 18 20  Temp: 37 C 36.8 C  SpO2: 99% 100%    Last Pain:  Vitals:   04/04/23 0620  TempSrc:   PainSc: Asleep                 Ginger Carne

## 2023-04-04 NOTE — Lactation Note (Addendum)
This note was copied from a baby's chart. Lactation Consultation Note  Patient Name: Girl Persephanie Laatsch Today's Date: 04/04/2023 Age:19 hours    Lactation Note:  Mother's choice on admission was breastmilk and formula. Met with mom this afternoon. Per mother her feeding plan is to only formula feed her baby. Per mom what she meant when she said breastmilk is she plans on hand expressing colostrum when she goes home to put in baby's bath water. Per mom she does not want to feed her milk to her baby.     Consult Status  Complete  Update provided to care nurse.  Fuller Song 04/04/2023, 5:49 PM

## 2023-04-04 NOTE — Progress Notes (Signed)
Progress Note - Vaginal Delivery  Whitney Rios is a 17 y.o. [phone] now PP day 1 s/p Vaginal, Spontaneous .   Subjective:  The patient reports no complaints, up ad lib, voiding, and tolerating PO   Objective:  Vital signs in last 24 hours: Temp:  [97.8 F (36.6 C)-98.6 F (37 C)] 98.2 F (36.8 C) (06/26 0749) Pulse Rate:  [56-143] 56 (06/26 0819) Resp:  [16-20] 18 (06/26 0749) BP: (73-151)/(35-131) 113/64 (06/26 0819) SpO2:  [94 %-100 %] 99 % (06/26 0819)  Physical Exam:  General: alert, cooperative, appears stated age, and no distress Lochia: appropriate Uterine Fundus: firm    Data Review Recent Labs    04/03/23 0045 04/04/23 0529  HGB 11.4* 9.4*  HCT 33.8* 29.1*    Assessment/Plan: Principal Problem:   Encounter for induction of labor Active Problems:   Postpartum care following vaginal delivery   Plan for discharge tomorrow  Discussed contraception with pt ,reviewed nexplanon, IUD, Depo injection , pill. She is undecided at this time. Encouraged her to talk with her partner to develop a plan for contraception.   -- Continue routine PP care.     Doreene Burke, CNM 04/04/2023 9:15 AM

## 2023-04-05 MED ORDER — FERROUS SULFATE 325 (65 FE) MG PO TBEC: 325.0000 mg | DELAYED_RELEASE_TABLET | Freq: Every day | ORAL | 0 refills | Status: AC

## 2023-04-05 NOTE — Clinical Social Work Maternal (Addendum)
  CLINICAL SOCIAL WORK MATERNAL/CHILD NOTE  Patient Details  Name: Whitney Rios MRN: [id number] Date of Birth: 2006/06/04  Date:  04/05/2023  Clinical Social Worker Initiating Note:  Darolyn Rua, [place] Date/Time: Initiated:  04/05/23/0830     Child's Name:  Whitney Rios   Reason for Referral:   (MJ positive)   Address:  [address] [place] [address]    Phone number:  [phone] (home)      Household Members/Support Persons (HM/SP):     mother in law, FOB Engineer, structural  Natural Supports (not living in the home):  Children, Extended Family   Financial Resources:  Medicaid    Cultural/Religious Considerations Which May Impact Care:    Strengths:  Ability to meet basic needs      Risk Factors/Current Problems:    Positive for marijuana, past CPS involvement   Mood/Affect:  Calm     CSW Assessment:   TOC consult for positive marijuana test for both patient and baby , CSW spoke with patient who confirms marijuana use and reports CPS hx. Aware report will need to be made per policy.   Patient does report having family support at home including her mother in law, extended family, and FOB Sarina Ser. She reports she will go back and forth between address listed in chart and [address] in [place] where [place] lives. Reports newborn is named Whitney Rios, and she has a 19y/o girl at home named Whitney Rios. Reports having all needs for baby at home, she does ask if baby will continue to need to be wrapped in blankets as it is warm outside, RN  made aware to follow up on education.   No other concerns at this time, CPS report has been made.   CSW Plan/Description:  Child Protective Service Report      Darolyn Rua, [place] 04/05/2023, 8:41 AM

## 2023-04-05 NOTE — Progress Notes (Signed)
Patient discharged home with infant. Discharge instructions and prescriptions given and reviewed with patient. Patient verbalized understanding. Escorted out by auxillary.  

## 2023-04-10 ENCOUNTER — Telehealth: Payer: Self-pay

## 2023-04-10 NOTE — Telephone Encounter (Signed)
[facility]- Discharge Call Backs-Left Voicemail about the following below 1-Do you have any questions or concerns about yourself as you heal?  C-Sec-Is your dressing off?/Vag Del? 2-Any concerns or questions about your baby? Is your baby eating, peeing,pooping well? 3- Reviewed ABC's of safe sleep. 4-How was your stay at the hospital? 5- Did our team work together to care for you? You should be receiving a survey in the mail soon.   We would really appreciate it if you could fill that out for us and return it in the mail.  We value the feedback to make improvements and continue the great work we do.   If you have any questions please feel free to call me back at [phone]  

## 2023-04-11 ENCOUNTER — Other Ambulatory Visit: Payer: Medicaid Other

## 2023-04-16 ENCOUNTER — Telehealth: Payer: Self-pay

## 2023-04-16 NOTE — Telephone Encounter (Signed)
Patient called back. She states she is okay to wait until Wednesday for her mood check. She is aware it is a virtual visit.

## 2023-04-16 NOTE — Telephone Encounter (Signed)
Health Dept Nurse, Marcelino Duster, calling to state patient is struggling with anxiety. At her home visit she reported EPDS of 9 but after that visit the patient did call back to report further concerns. Patient originally scheduled for a virtual visit on 04/18/23 for a mood check. Attempted to call patient to discuss concerns and coming in sooner for an evaluation, LVM.

## 2023-04-18 ENCOUNTER — Encounter: Payer: Self-pay | Admitting: Licensed Practical Nurse

## 2023-04-18 ENCOUNTER — Telehealth (INDEPENDENT_AMBULATORY_CARE_PROVIDER_SITE_OTHER): Payer: Medicaid Other | Admitting: Licensed Practical Nurse

## 2023-04-18 DIAGNOSIS — F53 Postpartum depression: Secondary | ICD-10-CM | POA: Insufficient documentation

## 2023-04-18 NOTE — Progress Notes (Signed)
Virtual Visit via Video Note  I connected with Whitney Rios on 04/18/23 at  3:55 PM EDT by a video enabled telemedicine application and verified that I am speaking with the correct person using two identifiers.  Location: Patient: located at her boyfriend's house in [place], [place] Provider: office in [place], [place]   I discussed the limitations of evaluation and management by telemedicine and the availability of in person appointments. The patient expressed understanding and agreed to proceed.  History of Present Illness: SVB 6/25 w/ MMF, IUGR female 4lbs 9oz, intact perineum  -Bleeding is light, almost gone -Appetite is normal -sleep" gets 3 hour stretches, does not nap, does not feel rested -no concerns with voiding, stooling, or perineum -does not desire another pregnancy, would like to avoid hormones and they make her "crazy" -Pumping, pumps every 2-3 hours while awake, gets 3-5 oz each time -Mood; has been all over, [place] is crying a lot, has been angry then feels happy at moments. Last week she did have a thought about slitting her wrists, but acknowledges she could not do that and leave her kids. She is 100% sure she would not hurt herself. States she has had these kinds of thoughts in the past. She does not want to leave her children to be admitted to the hospital. She is open to therapy and medication.  Observations/Objective: GEN; pt sitting in the dark, difficult to fully see pt. Speaking in clear coherent sentences.   EPDS 15   Assessment and Plan: Care of lactating woman  PPD  Follow Up Instructions: -rec going to [facility] for inpatient treatment; pt declines. I am reluctant to prescribe medication as a known side effects is SI. Referral to pysh made.  Stressed the importance to telling someone right away if she has any thoughts of self harm, please go to [facility] if you experience these thoughts.   RTC in 4 weeks    I discussed the assessment and treatment plan with the patient.  The patient was provided an opportunity to ask questions and all were answered. The patient agreed with the plan and demonstrated an understanding of the instructions.   The patient was advised to call back or seek an in-person evaluation if the symptoms worsen or if the condition fails to improve as anticipated.  I provided 12 minutes of non-face-to-face time during this encounter.   Ellouise Newer Ayvion Kavanagh, CNM

## 2023-04-20 ENCOUNTER — Telehealth: Payer: Self-pay | Admitting: Licensed Practical Nurse

## 2023-04-20 NOTE — Telephone Encounter (Signed)
Patient aware referral has been sent. Patient has been provided contact information for scheduling.

## 2023-05-21 ENCOUNTER — Encounter: Payer: Self-pay | Admitting: Obstetrics

## 2023-05-21 ENCOUNTER — Ambulatory Visit (INDEPENDENT_AMBULATORY_CARE_PROVIDER_SITE_OTHER): Payer: Medicaid Other | Admitting: Obstetrics

## 2023-05-21 VITALS — BP 110/72 | HR 56 | Ht 63.0 in | Wt 119.4 lb

## 2023-05-21 DIAGNOSIS — Z3042 Encounter for surveillance of injectable contraceptive: Secondary | ICD-10-CM | POA: Diagnosis not present

## 2023-05-21 LAB — POCT URINALYSIS DIPSTICK OB
Bilirubin, UA: NEGATIVE
Blood, UA: NEGATIVE
Glucose, UA: NEGATIVE
Ketones, UA: NEGATIVE
Leukocytes, UA: NEGATIVE
Nitrite, UA: NEGATIVE
Spec Grav, UA: 1.015 (ref 1.010–1.025)
Urobilinogen, UA: 0.2 E.U./dL
pH, UA: 6.5 (ref 5.0–8.0)

## 2023-05-21 MED ORDER — MEDROXYPROGESTERONE ACETATE 150 MG/ML IM SUSY
150.0000 mg | PREFILLED_SYRINGE | Freq: Once | INTRAMUSCULAR | Status: AC
Start: 2023-05-21 — End: 2023-05-21
  Administered 2023-05-21: 150 mg via INTRAMUSCULAR

## 2023-05-21 NOTE — Progress Notes (Signed)
   OBSTETRICS POSTPARTUM CLINIC PROGRESS NOTE  Subjective:     Whitney Rios is a 17 y.o. G19P2002 female who presents for a postpartum visit. She is 6 weeks postpartum following a spontaneous vaginal delivery. I have fully reviewed the prenatal and intrapartum course. The delivery was at 37.1 gestational weeks.  Anesthesia: epidural. Postpartum course has been good. Baby's course has been going well. Baby is feeding by bottle - Nutrimagin . Bleeding: patient has resumed menses, with Patient's last menstrual period was 05/18/2023 (exact date).. Bowel function is normal. Bladder function is abnormal: She feels a lot of pressure after she urinates . Patient is sexually active. Contraception method desired is Depo.Postpartum depression screening: negative.  EDPS score is 15. She met with Carie Caddy a few weeks back and was verbalizing issues of depression and stress. She has been referred to [facility], and was contacted today for an appointment. Today she also shares that she is in a better place emotionally, as she has moved back in with her sister and feels better supported.    The following portions of the patient's history were reviewed and updated as appropriate: allergies, current medications, past family history, past medical history, past social history, past surgical history, and problem list.  Review of Systems Pertinent items are noted in HPI.   Objective:    BP 110/72   Pulse (!) 56   Ht 5\' 3"  (1.6 m)   Wt 119 lb 6.4 oz (54.2 kg)   LMP 05/18/2023 (Exact Date)   Breastfeeding No   BMI 21.15 kg/m   General:  alert and no distress   Breasts:  inspection negative, no nipple discharge or bleeding, no masses or nodularity palpable  Lungs: clear to auscultation bilaterally  Heart:  regular rate and rhythm, S1, S2 normal, no murmur, click, rub or gallop  Abdomen: soft, non-tender; bowel sounds normal; no masses,  no organomegaly.  Well healed Pfannenstiel incision   Vulva:   normal  Vagina: normal vagina, no discharge, exudate, lesion, or erythema  Cervix:  no cervical motion tenderness and no lesions  Corpus: normal size, retroverted, contour, position, consistency, mobility, non-tender  Adnexa:  normal adnexa and no mass, fullness, tenderness  Rectal Exam: Not performed.         Labs:  Lab Results  Component Value Date   HGB 9.4 (L) 04/04/2023     Assessment:   1. Postpartum care following vaginal delivery      Plan:    1. Contraception: After much discussion, she opts for Depo today. The importance of Dep[o every 3 months is covered, as well as the usual bleeding patterns that can occur with the first few shots. 2. Will check Hgb for h/o postpartum anemia of less than 10. She is encouraged to continue taking prenatal vitamins or a daily vitamin 3. Follow up in:  3-4  months for annual exam.     She will be due for her next Depo at the time of her annual   Paula Compton, CNM [facility]

## 2023-05-21 NOTE — Patient Instructions (Signed)

## 2023-05-23 ENCOUNTER — Encounter: Payer: Self-pay | Admitting: *Deleted

## 2023-05-23 ENCOUNTER — Other Ambulatory Visit: Payer: Self-pay

## 2023-05-23 ENCOUNTER — Emergency Department
Admission: EM | Admit: 2023-05-23 | Discharge: 2023-05-23 | Disposition: A | Discharge status: Home / Self Care | Payer: Medicaid Other | Attending: Emergency Medicine | Admitting: Emergency Medicine

## 2023-05-23 DIAGNOSIS — J45909 Unspecified asthma, uncomplicated: Secondary | ICD-10-CM | POA: Insufficient documentation

## 2023-05-23 DIAGNOSIS — R112 Nausea with vomiting, unspecified: Secondary | ICD-10-CM

## 2023-05-23 DIAGNOSIS — U071 COVID-19: Secondary | ICD-10-CM | POA: Insufficient documentation

## 2023-05-23 LAB — URINALYSIS, ROUTINE W REFLEX MICROSCOPIC
Bilirubin Urine: NEGATIVE
Glucose, UA: NEGATIVE mg/dL
Ketones, ur: 80 mg/dL — AB
Leukocytes,Ua: NEGATIVE
Nitrite: NEGATIVE
Protein, ur: 30 mg/dL — AB
Specific Gravity, Urine: 1.02 (ref 1.005–1.030)
pH: 8 (ref 5.0–8.0)

## 2023-05-23 LAB — COMPREHENSIVE METABOLIC PANEL
ALT: 12 U/L (ref 0–44)
AST: 19 U/L (ref 15–41)
Albumin: 4.8 g/dL (ref 3.5–5.0)
Alkaline Phosphatase: 78 U/L (ref 38–126)
Anion gap: 10 (ref 5–15)
BUN: 7 mg/dL (ref 6–20)
CO2: 20 mmol/L — ABNORMAL LOW (ref 22–32)
Calcium: 9.7 mg/dL (ref 8.9–10.3)
Chloride: 111 mmol/L (ref 98–111)
Creatinine, Ser: 0.65 mg/dL (ref 0.44–1.00)
GFR, Estimated: >60 mL/min (ref >60)
Glucose, Bld: 153 mg/dL — ABNORMAL HIGH (ref 70–99)
Potassium: 3.3 mmol/L — ABNORMAL LOW (ref 3.5–5.1)
Sodium: 141 mmol/L (ref 135–145)
Total Bilirubin: 0.7 mg/dL (ref 0.3–1.2)
Total Protein: 7.7 g/dL (ref 6.5–8.1)

## 2023-05-23 LAB — CBC
HCT: 42.2 % (ref 36.0–46.0)
Hemoglobin: 14.1 g/dL (ref 12.0–15.0)
MCH: 27.8 pg (ref 26.0–34.0)
MCHC: 33.4 g/dL (ref 30.0–36.0)
MCV: 83.1 fL (ref 80.0–100.0)
Platelets: 353 10*3/uL (ref 150–400)
RBC: 5.08 MIL/uL (ref 3.87–5.11)
RDW: 13.3 % (ref 11.5–15.5)
WBC: 12 10*3/uL — ABNORMAL HIGH (ref 4.0–10.5)
nRBC: 0 % (ref 0.0–0.2)

## 2023-05-23 LAB — SARS CORONAVIRUS 2 BY RT PCR: SARS Coronavirus 2 by RT PCR: POSITIVE — AB

## 2023-05-23 LAB — LIPASE, BLOOD: Lipase: 24 U/L (ref 11–51)

## 2023-05-23 LAB — POC URINE PREG, ED: Preg Test, Ur: NEGATIVE

## 2023-05-23 MED ORDER — ONDANSETRON HCL 4 MG/2ML IJ SOLN
4.0000 mg | Freq: Once | INTRAMUSCULAR | Status: AC
  Administered 2023-05-23: 4 mg via INTRAVENOUS

## 2023-05-23 MED ORDER — ONDANSETRON HCL 4 MG PO TABS: 4.0000 mg | ORAL_TABLET | Freq: Every day | ORAL | 1 refills | Status: AC | PRN

## 2023-05-23 MED ORDER — DIPHENHYDRAMINE HCL 50 MG/ML IJ SOLN
25.0000 mg | Freq: Once | INTRAMUSCULAR | Status: AC
  Administered 2023-05-23: 25 mg via INTRAVENOUS
  Filled 2023-05-23: qty 1

## 2023-05-23 MED ORDER — ALUM & MAG HYDROXIDE-SIMETH 200-200-20 MG/5ML PO SUSP
15.0000 mL | Freq: Once | ORAL | Status: DC
  Filled 2023-05-23: qty 30

## 2023-05-23 MED ORDER — ALUM & MAG HYDROXIDE-SIMETH 200-200-20 MG/5ML PO SUSP
30.0000 mL | Freq: Once | ORAL | Status: AC
  Administered 2023-05-23: 30 mL via ORAL

## 2023-05-23 MED ORDER — FAMOTIDINE IN NACL 20-0.9 MG/50ML-% IV SOLN
20.0000 mg | Freq: Once | INTRAVENOUS | Status: AC
  Administered 2023-05-23: 20 mg via INTRAVENOUS
  Filled 2023-05-23: qty 50

## 2023-05-23 MED ORDER — DROPERIDOL 2.5 MG/ML IJ SOLN
1.2500 mg | Freq: Once | INTRAMUSCULAR | Status: AC
  Administered 2023-05-23: 1.25 mg via INTRAVENOUS
  Filled 2023-05-23: qty 2

## 2023-05-23 NOTE — ED Notes (Signed)
Pt in recliner in subwait, warm blankets given. Pt continues to have belching and vomiting

## 2023-05-23 NOTE — ED Provider Notes (Signed)
[facility] Emergency Department Provider Note     Event Date/Time   First MD Initiated Contact with Patient 05/23/23 1504     (approximate)   History   Emesis   HPI  Whitney Rios is a 17 y.o. female with a medical history of GERD, asthma and daily marijuana use presents to the ED with complaints of persistent nausea and vomiting x 3 days. Progressively worsening.  Associated symptoms include upper abdominal pain and chills.  Patient endorses sick contacts.  Daughter was positive for COVID a week ago.  Has tried Tylenol with no relief. Denies fever.     Physical Exam   Triage Vital Signs: ED Triage Vitals  Encounter Vitals Group     BP 05/23/23 1415 132/88     Systolic BP Percentile --      Diastolic BP Percentile --      Pulse Rate 05/23/23 1415 87     Resp 05/23/23 1415 20     Temp 05/23/23 1415 98 F (36.7 C)     Temp Source 05/23/23 1415 Oral     SpO2 05/23/23 1415 100 %     Weight 05/23/23 1416 119 lb (54 kg)     Height --      Head Circumference --      Peak Flow --      Pain Score 05/23/23 1415 10     Pain Loc --      Pain Education --      Exclude from Growth Chart --     Most recent vital signs: Vitals:   05/23/23 1415  BP: 132/88  Pulse: 87  Resp: 20  Temp: 98 F (36.7 C)  SpO2: 100%    General: Alert and oriented.  Actively belching.  Head:  NCAT.  Eyes:  PERRLA. EOMI. Conjunctivae clear.  CV:  Good peripheral perfusion. RRR.  RESP:  Normal effort. LCTAB. No retractions.  ABD:  No distention. Soft.  Mild tenderness over epigastric region.  No RLQ tenderness or RUQ. No masses or organomegaly.   MSK:   Full ROM in all joints. No swelling, deformity or tenderness.  NEURO: Cranial nerves II-XII intact. No focal deficits. Sensation and motor function intact.  Gait is steady.  ED Results / Procedures / Treatments   Labs (all labs ordered are listed, but only abnormal results are displayed) Labs Reviewed  SARS  CORONAVIRUS 2 BY RT PCR - Abnormal; Notable for the following components:      Result Value   SARS Coronavirus 2 by RT PCR POSITIVE (*)    All other components within normal limits  COMPREHENSIVE METABOLIC PANEL - Abnormal; Notable for the following components:   Potassium 3.3 (*)    CO2 20 (*)    Glucose, Bld 153 (*)    All other components within normal limits  CBC - Abnormal; Notable for the following components:   WBC 12.0 (*)    All other components within normal limits  URINALYSIS, ROUTINE W REFLEX MICROSCOPIC - Abnormal; Notable for the following components:   Color, Urine YELLOW (*)    APPearance HAZY (*)    Hgb urine dipstick MODERATE (*)    Ketones, ur 80 (*)    Protein, ur 30 (*)    Bacteria, UA RARE (*)    All other components within normal limits  LIPASE, BLOOD  POC URINE PREG, ED  No results found.  PROCEDURES:  Critical Care performed: No  Procedures  MEDICATIONS ORDERED  IN ED: Medications  ondansetron (ZOFRAN) injection 4 mg (4 mg Intravenous Given 05/23/23 1429)  famotidine (PEPCID) IVPB 20 mg premix (0 mg Intravenous Stopped 05/23/23 1539)  alum & mag hydroxide-simeth (MAALOX/MYLANTA) 200-200-20 MG/5ML suspension 30 mL (30 mLs Oral Given 05/23/23 1433)  droperidol (INAPSINE) 2.5 MG/ML injection 1.25 mg (1.25 mg Intravenous Given 05/23/23 1537)  diphenhydrAMINE (BENADRYL) injection 25 mg (25 mg Intravenous Given 05/23/23 1541)    IMPRESSION / MDM / ASSESSMENT AND PLAN / ED COURSE  I reviewed the triage vital signs and the nursing notes.                              Clinical Course as of 05/24/23 0949  Wed May 23, 2023  1605 Reevaluation patient is no longer complaining of abdominal tenderness on palpation of epigastric region. [MH]    Clinical Course User Index [MH] Kern Reap A, PA-C    17 y.o. female presents to the emergency department for evaluation and treatment of acute nausea and vomiting with a history of GERD and daily marijuana use. See  HPI for further details. Vital signs are unremarkable.    Differential diagnosis includes, but is not limited to electrolyte imbalance, pancreatitis, cholecystitis, COVID, gastritis, cannabinoid hyperemesis syndrome, UTI.  Patient's presentation is most consistent with acute illness / injury with system symptoms.  Basic labs are obtained and overall reassuring.  There are no electrolyte imbalances given her active vomiting/belching status.  Pregnancy test is negative and urinalysis reveals moderate amount of blood.  On informing patient, patient states she is currently on her menstrual cycle.  This is reassuring.  Lipase is normal.  Given her history of sick contacts of COVID I will order a COVID test.  COVID test is positive.  This is correlating with her symptoms.  I also believe possible CHS may be causing her symptoms given her chronic, daily use of marijuana and feeling better after hot showers.  We had a brief discussion of marijuana use and cessation.  Following ED treatment patient is clinically improved and reports she no longer feels nauseous.  Patient will be discharged home.  She is in stable condition.  Close follow-up with primary care encouraged. Patient is given ED precautions to return to the ED for any worsening or new symptoms. Patient verbalizes understanding. All questions and concerns were addressed during ED visit.     FINAL CLINICAL IMPRESSION(S) / ED DIAGNOSES   Final diagnoses:  Nausea and vomiting, unspecified vomiting type  COVID-19   Rx / DC Orders   ED Discharge Orders          Ordered    ondansetron (ZOFRAN) 4 MG tablet  Daily PRN        05/23/23 1715             Note:  This document was prepared using Dragon voice recognition software and may include unintentional dictation errors.    Romeo Apple, Jubilee Vivero A, PA-C 05/24/23 1191    Janith Lima, MD 05/24/23 [phone]

## 2023-05-23 NOTE — ED Triage Notes (Signed)
Pt is here for continual nausea and vomiting.  Nausea and vomiting which began 3 days ago and continual vomiting today.  Pt is having vomiting and belching in triage.  Pt is having upper abdominal pain, hx of same and was dx with acid reflux

## 2023-05-23 NOTE — Discharge Instructions (Addendum)

## 2023-05-24 ENCOUNTER — Telehealth: Payer: Self-pay

## 2023-05-24 NOTE — Telephone Encounter (Signed)
Whitney Rios, patient mom, calling back to follow up. She reports the burning didn't start until today. She is running a fever. Patient had ED visit yesterday (+Covid). They are currently without a car.

## 2023-05-24 NOTE — Telephone Encounter (Signed)
TRIAGE VOICEMAIL: Patient reports she has burning/stinging when she pees. She was seen in the office the other day. Requesting rx for UTI as she believes that is what she has. [phone]

## 2023-05-24 NOTE — Telephone Encounter (Signed)
Her urine test does not show any signs of infection that she had in the ER yesterday.  Can prescribe Uribel or Pyridium.

## 2023-05-25 ENCOUNTER — Other Ambulatory Visit: Payer: Self-pay

## 2023-05-25 ENCOUNTER — Emergency Department
Admission: EM | Admit: 2023-05-25 | Discharge: 2023-05-25 | Disposition: A | Discharge status: Home / Self Care | Payer: Medicaid Other | Source: Home / Self Care | Attending: Emergency Medicine | Admitting: Emergency Medicine

## 2023-05-25 ENCOUNTER — Emergency Department
Admission: EM | Admit: 2023-05-25 | Discharge: 2023-05-25 | Discharge status: Against Medical Advice | Payer: Medicaid Other | Attending: Emergency Medicine | Admitting: Emergency Medicine

## 2023-05-25 ENCOUNTER — Encounter: Payer: Self-pay | Admitting: Emergency Medicine

## 2023-05-25 ENCOUNTER — Emergency Department: Payer: Medicaid Other

## 2023-05-25 DIAGNOSIS — R059 Cough, unspecified: Secondary | ICD-10-CM | POA: Diagnosis present

## 2023-05-25 DIAGNOSIS — Z5321 Procedure and treatment not carried out due to patient leaving prior to being seen by health care provider: Secondary | ICD-10-CM | POA: Diagnosis not present

## 2023-05-25 DIAGNOSIS — U071 COVID-19: Secondary | ICD-10-CM | POA: Insufficient documentation

## 2023-05-25 DIAGNOSIS — R112 Nausea with vomiting, unspecified: Secondary | ICD-10-CM

## 2023-05-25 LAB — COMPREHENSIVE METABOLIC PANEL
ALT: 13 U/L (ref 0–44)
AST: 18 U/L (ref 15–41)
Albumin: 5.3 g/dL — ABNORMAL HIGH (ref 3.5–5.0)
Alkaline Phosphatase: 78 U/L (ref 38–126)
Anion gap: 19 — ABNORMAL HIGH (ref 5–15)
BUN: 8 mg/dL (ref 6–20)
CO2: 17 mmol/L — ABNORMAL LOW (ref 22–32)
Calcium: 10 mg/dL (ref 8.9–10.3)
Chloride: 101 mmol/L (ref 98–111)
Creatinine, Ser: 0.72 mg/dL (ref 0.44–1.00)
GFR, Estimated: >60 mL/min (ref >60)
Glucose, Bld: 115 mg/dL — ABNORMAL HIGH (ref 70–99)
Potassium: 3.2 mmol/L — ABNORMAL LOW (ref 3.5–5.1)
Sodium: 137 mmol/L (ref 135–145)
Total Bilirubin: 1.2 mg/dL (ref 0.3–1.2)
Total Protein: 8.2 g/dL — ABNORMAL HIGH (ref 6.5–8.1)

## 2023-05-25 LAB — CBC
HCT: 41.4 % (ref 36.0–46.0)
Hemoglobin: 14.5 g/dL (ref 12.0–15.0)
MCH: 27.9 pg (ref 26.0–34.0)
MCHC: 35 g/dL (ref 30.0–36.0)
MCV: 79.6 fL — ABNORMAL LOW (ref 80.0–100.0)
Platelets: 413 10*3/uL — ABNORMAL HIGH (ref 150–400)
RBC: 5.2 MIL/uL — ABNORMAL HIGH (ref 3.87–5.11)
RDW: 13.5 % (ref 11.5–15.5)
WBC: 17 10*3/uL — ABNORMAL HIGH (ref 4.0–10.5)
nRBC: 0 % (ref 0.0–0.2)

## 2023-05-25 LAB — URINALYSIS, ROUTINE W REFLEX MICROSCOPIC
Bilirubin Urine: NEGATIVE
Glucose, UA: NEGATIVE mg/dL
Ketones, ur: 80 mg/dL — AB
Leukocytes,Ua: NEGATIVE
Nitrite: NEGATIVE
Protein, ur: 100 mg/dL — AB
Specific Gravity, Urine: 1.032 — ABNORMAL HIGH (ref 1.005–1.030)
pH: 5 (ref 5.0–8.0)

## 2023-05-25 LAB — LIPASE, BLOOD: Lipase: 24 U/L (ref 11–51)

## 2023-05-25 MED ORDER — METOCLOPRAMIDE HCL 5 MG/ML IJ SOLN
10.0000 mg | Freq: Once | INTRAMUSCULAR | Status: AC
  Administered 2023-05-25: 10 mg via INTRAVENOUS
  Filled 2023-05-25: qty 2

## 2023-05-25 MED ORDER — ALUM & MAG HYDROXIDE-SIMETH 200-200-20 MG/5ML PO SUSP
30.0000 mL | Freq: Once | ORAL | Status: AC
  Administered 2023-05-25: 30 mL via ORAL
  Filled 2023-05-25: qty 30

## 2023-05-25 MED ORDER — NAPROXEN 500 MG PO TABS: 500.0000 mg | ORAL_TABLET | Freq: Two times a day (BID) | ORAL | 0 refills | Status: AC

## 2023-05-25 MED ORDER — SODIUM CHLORIDE 0.9 % IV BOLUS
1000.0000 mL | Freq: Once | INTRAVENOUS | Status: AC
  Administered 2023-05-25: 1000 mL via INTRAVENOUS

## 2023-05-25 NOTE — ED Triage Notes (Addendum)
TEsted positive for COVID on Wednesday.  C/O N/V x 1 week.  Daughter had COVID last week.  Seen through ED Wednesday night for same.  States Zofran is not helping N/V  Sent from [facility] for ED evaluation.

## 2023-05-25 NOTE — ED Notes (Signed)
No answer when called several times from lobby 

## 2023-05-25 NOTE — Telephone Encounter (Signed)
Called [facility] ED, transferred to Dorie Rank, Paramedic assisting with this patient. Advised of patient complaints to [place] regarding UTI symptoms with negative UA here 8/12 and at ED on 8/14. Advised of Dr. Oretha Milch recommendation for Uribel or Pyridium. Request ED collect/send culture. Likely patient having symptoms due to dehydration from n/v with Covid. Merry Proud will address with ED provider.

## 2023-05-25 NOTE — ED Triage Notes (Signed)
Pt presents ambulatory to triage via POV with complaints of a cough and is COVID +. Pt was seen here last night for same but states the coughing is more persistent. A&Ox4 at this time. Denies CP or SOB.

## 2023-05-25 NOTE — Telephone Encounter (Signed)
Chart reviewed. Patient currently at ED being evaluated for Covid Symptoms not relieved by previous meds given.

## 2023-05-25 NOTE — ED Triage Notes (Deleted)
Pt comes with needing psych evaluation. Pt states his probation officer stated he needs to get one. 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 Pt states he is suppose to be taking them.  Pt denies any SI or HI.  Pt states he has been hearing voices and hallucinations. Pt is calm and cooperative.

## 2023-05-25 NOTE — ED Provider Notes (Signed)
[facility] Provider Note    Event Date/Time   First MD Initiated Contact with Patient 05/25/23 [phone]     (approximate)   History   Emesis   HPI  Whitney Rios is a 17 y.o. female with a past medical history of daily marijuana use, recent diagnosis of COVID-19 2 days ago who presents today for evaluation of nausea and vomiting.  Patient denies any specific abdominal pain, though reports that she has burning when she vomits.  No chest pain or shortness of breath.  No fever or chills.  She reports that she still has bodyaches from the COVID.  She is requesting a medication to help with her body aches.  She reports that she has not smoked marijuana since yesterday.  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   Short interval between pregnancies affecting pregnancy, antepartum 10/12/2022   Supervision of other normal pregnancy, antepartum 08/24/2022   Postpartum care following vaginal delivery 06/27/2021          Physical Exam   Triage Vital Signs: ED Triage Vitals  Encounter Vitals Group     BP 05/25/23 0916 (!) 118/97     Systolic BP Percentile --      Diastolic BP Percentile --      Pulse Rate 05/25/23 0916 81     Resp 05/25/23 0916 20     Temp 05/25/23 0916 97.7 F (36.5 C)     Temp Source 05/25/23 0916 Oral     SpO2 05/25/23 0916 100 %     Weight 05/25/23 0915 119 lb 0.8 oz (54 kg)     Height 05/25/23 0915 5\' 3"  (1.6 m)     Head Circumference --      Peak Flow --      Pain Score 05/25/23 0858 0     Pain Loc --      Pain Education --      Exclude from Growth Chart --     Most recent vital signs: Vitals:   05/25/23 0916 05/25/23 1236  BP: (!) 118/97 121/71  Pulse: 81 79  Resp: 20 20  Temp: 97.7 F (36.5 C) 98.4 F (36.9 C)  SpO2: 100% 100%    Physical Exam Vitals and nursing note reviewed.   Constitutional:      General: Awake and alert. No acute distress.    Appearance: Normal appearance. The patient is normal weight.  HENT:     Head: Normocephalic and atraumatic.     Mouth: Mucous membranes are moist.  Eyes:     General: PERRL. Normal EOMs        Right eye: No discharge.        Left eye: No discharge.     Conjunctiva/sclera: Conjunctivae normal.  Cardiovascular:     Rate and Rhythm: Normal rate and regular rhythm.     Pulses: Normal pulses.  Pulmonary:     Effort: Pulmonary effort is normal. No respiratory distress.     Breath sounds: Normal breath sounds.  Abdominal:     Abdomen is soft. There is no abdominal tenderness. No rebound or guarding. No distention. Musculoskeletal:        General: No swelling. Normal range of motion.     Cervical back: Normal range of motion and neck supple.  Skin:    General: Skin is warm and dry.  Capillary Refill: Capillary refill takes less than 2 seconds.     Findings: No rash.  Neurological:     Mental Status: The patient is awake and alert.      ED Results / Procedures / Treatments   Labs (all labs ordered are listed, but only abnormal results are displayed) Labs Reviewed  COMPREHENSIVE METABOLIC PANEL - Abnormal; Notable for the following components:      Result Value   Potassium 3.2 (*)    CO2 17 (*)    Glucose, Bld 115 (*)    Total Protein 8.2 (*)    Albumin 5.3 (*)    Anion gap 19 (*)    All other components within normal limits  CBC - Abnormal; Notable for the following components:   WBC 17.0 (*)    RBC 5.20 (*)    MCV 79.6 (*)    Platelets 413 (*)    All other components within normal limits  URINALYSIS, ROUTINE W REFLEX MICROSCOPIC - Abnormal; Notable for the following components:   Color, Urine YELLOW (*)    APPearance HAZY (*)    Specific Gravity, Urine 1.032 (*)    Hgb urine dipstick LARGE (*)    Ketones, ur 80 (*)    Protein, ur 100 (*)    Bacteria, UA MANY (*)    All other components within  normal limits  URINE CULTURE  LIPASE, BLOOD  PREGNANCY, URINE  POC URINE PREG, ED     EKG     RADIOLOGY     PROCEDURES:  Critical Care performed:   Procedures   MEDICATIONS ORDERED IN ED: Medications  sodium chloride 0.9 % bolus 1,000 mL (0 mLs Intravenous Stopped 05/25/23 1145)  alum & mag hydroxide-simeth (MAALOX/MYLANTA) 200-200-20 MG/5ML suspension 30 mL (30 mLs Oral Given 05/25/23 1018)  metoCLOPramide (REGLAN) injection 10 mg (10 mg Intravenous Given 05/25/23 1017)  alum & mag hydroxide-simeth (MAALOX/MYLANTA) 200-200-20 MG/5ML suspension 30 mL (30 mLs Oral Given 05/25/23 1144)     IMPRESSION / MDM / ASSESSMENT AND PLAN / ED COURSE  I reviewed the triage vital signs and the nursing notes.   Differential diagnosis includes, but is not limited to, cannabis hyperemesis, COVID-19, electrolyte disarray, gastroenteritis.  I reviewed the patient's chart.  Patient was seen in the emergency department 2 days ago with the same complaint.  She was found to be COVID-positive.  She was treated symptomatically and her symptoms were felt to be due to COVID and cannabis hyperemesis.  Patient is awake and alert, hemodynamically stable and afebrile.  She is dry heaving on arrival, though has no emesis in her emesis basin.  Her abdomen is soft and nontender throughout.  Labs obtained in triage are revealing for a leukocytosis to 17 which may impart be due to her vomiting.  She has no abdominal tenderness on exam to suggest surgical etiology.  She was treated symptomatically with Reglan and IV fluids, as well as a GI cocktail with significant improvement of her symptoms.  She does not discharge upon reevaluation, she is resting comfortably on the stretcher with her significant other.  We discussed continuing isolation instructions given her recent COVID diagnosis when she requires medical attention.  We also discussed strict return precautions and the importance of continuing her marijuana  cessation.  Patient understands and agrees with plan.  She was discharged in stable condition.  Patient's presentation is most consistent with acute complicated illness / injury requiring diagnostic workup.      FINAL CLINICAL IMPRESSION(S) /  ED DIAGNOSES   Final diagnoses:  Nausea and vomiting, unspecified vomiting type  COVID-19     Rx / DC Orders   ED Discharge Orders          Ordered    naproxen (NAPROSYN) 500 MG tablet  2 times daily with meals        05/25/23 1214             Note:  This document was prepared using Dragon voice recognition software and may include unintentional dictation errors.   Keturah Shavers 05/25/23 1349    Minna Antis, MD 05/25/23 1505

## 2023-05-25 NOTE — Discharge Instructions (Signed)
You may take the medication that was prescribed for your muscle aches.  Please remain in isolation for your COVID-19 unless you require medical attention.  Please return for any new, worsening, or changing symptoms or other concerns.

## 2023-06-04 ENCOUNTER — Ambulatory Visit (INDEPENDENT_AMBULATORY_CARE_PROVIDER_SITE_OTHER): Payer: Medicaid Other

## 2023-06-04 VITALS — BP 114/78 | HR 108 | Wt 108.1 lb

## 2023-06-04 DIAGNOSIS — N6320 Unspecified lump in the left breast, unspecified quadrant: Secondary | ICD-10-CM

## 2023-06-04 NOTE — Progress Notes (Signed)
   GYN ENCOUNTER  Encounter for breast concerns.  Subjective  HPI: Whitney Rios is a 17 y.o. [phone] who presents today for concerns about lump in her left breast that she noted over the weekend. She says that she woke up on Saturday morning and noted a painful dime-sized lump on the outer side of her left breast. The lump resolved later that day but since she has a history of breast cancer in several of her maternal great aunts, she wanted to be seen for reassurance.   She recently gave birth on 04/03/23 and breast fed for approximately 2 weeks. She is no longer breastfeeding at this time.   Past Medical History:  Diagnosis Date   Asthma    GERD (gastroesophageal reflux disease)    Past Surgical History:  Procedure Laterality Date   DENTAL SURGERY     TONSILLECTOMY AND ADENOIDECTOMY     OB History     Gravida  2   Para  2   Term  2   Preterm  0   AB  0   Living  2      SAB  0   IAB  0   Ectopic  0   Multiple  0   Live Births  2          No Known Allergies  Review of Systems  12 point ROS negative except for pertinent positives noted in HPO above.   Objective  BP 114/78   Pulse (!) 108   Wt 108 lb 1.6 oz (49 kg)   LMP 05/18/2023 (Exact Date)   Breastfeeding No   BMI 19.15 kg/m   Physical examination GENERAL APPEARANCE: alert, well appearing BREASTS: no masses noted, no significant tenderness, no palpable axillary nodes, no skin changes LUNGS: normal work of breathing HEART: elevated heart rate  Assessment/Plan - Reviewed possible etiologies of breast lump including clogged milk duct or fiborcystic breast tissue. Provided reassurance of low suspicion for any malignant etiology given quick resolution of lump. Reviewed guidance for breast self-awareness. - Follow up for annual physical or other concerns.   Lindalou Hose Mariajose Mow, CNM  06/04/23 10:16 AM

## 2023-06-26 ENCOUNTER — Other Ambulatory Visit: Payer: Self-pay | Admitting: Obstetrics

## 2023-06-26 ENCOUNTER — Telehealth: Payer: Self-pay

## 2023-06-26 NOTE — Telephone Encounter (Signed)
Pt calling; delivered 21m ago; put on depo; has been bleeding for over a month; MMF adv her if she had trouble with bleeding to let her know and she would rx some pills to help stop the bleeding.  Pt would like that rx.  [phone]

## 2023-06-26 NOTE — Telephone Encounter (Signed)
Patient continues to have vaginal bleeding since being given her Depo shot. Bleeding is daily, not heavy. I will put in a RX for low dosage OCPs for her for one month.

## 2023-06-26 NOTE — Progress Notes (Signed)
This patient has called the office c/o ongoing , daily vaginal bleeding.she has been on the Depo for over two months. I have left one pack of lo loestrin Fe for her to use. Reached her by phone and she will come in and pick up a sample pack. She is instructed to take the first three weeks of pills in the pack.  Mirna Mires, CNM  06/26/2023 4:20 PM

## 2023-08-13 ENCOUNTER — Ambulatory Visit (INDEPENDENT_AMBULATORY_CARE_PROVIDER_SITE_OTHER): Payer: Medicaid Other

## 2023-08-13 VITALS — BP 105/55 | HR 65 | Ht 63.0 in | Wt 110.0 lb

## 2023-08-13 DIAGNOSIS — Z3042 Encounter for surveillance of injectable contraceptive: Secondary | ICD-10-CM

## 2023-08-13 MED ORDER — MEDROXYPROGESTERONE ACETATE 150 MG/ML IM SUSY
150.0000 mg | PREFILLED_SYRINGE | Freq: Once | INTRAMUSCULAR | Status: AC
Start: 2023-08-13 — End: 2023-08-13
  Administered 2023-08-13: 150 mg via INTRAMUSCULAR

## 2023-08-13 NOTE — Progress Notes (Cosign Needed Addendum)
    NURSE VISIT NOTE  Subjective:    Patient ID: Whitney Rios, female    DOB: [birth year], 17 y.o.   MRN: [id number]  HPI  Patient is a 17 y.o. G83P2002 female who presents for depo provera injection.   Objective:    There were no vitals taken for this visit.  Last Annual: PP. Last pap: NA. Last Depo-Provera: 05/21/23. Side Effects if any: none. Serum HCG indicated? No . Depo-Provera 150 mg IM given by: Beverely Pace, CMA. Site: Right Upper Outer Quandrant   Assessment:   1. Encounter for Depo-Provera contraception      Plan:  Next appointment due between Jan 20-Feb 3    Loney Laurence, CMA

## 2023-08-17 NOTE — Addendum Note (Signed)
Addended by: Loney Laurence on: 08/17/2023 03:44 PM   Modules accepted: Level of Service

## 2023-11-05 ENCOUNTER — Ambulatory Visit: Payer: Medicaid Other

## 2023-11-05 DIAGNOSIS — Z3042 Encounter for surveillance of injectable contraceptive: Secondary | ICD-10-CM

## 2023-11-05 NOTE — Progress Notes (Signed)
 This encounter was created in error - please disregard.

## 2023-11-09 ENCOUNTER — Ambulatory Visit (INDEPENDENT_AMBULATORY_CARE_PROVIDER_SITE_OTHER): Payer: Medicaid Other

## 2023-11-09 VITALS — BP 102/64 | HR 80 | Ht 62.0 in | Wt 103.5 lb

## 2023-11-09 DIAGNOSIS — Z3042 Encounter for surveillance of injectable contraceptive: Secondary | ICD-10-CM | POA: Diagnosis not present

## 2023-11-09 MED ORDER — MEDROXYPROGESTERONE ACETATE 150 MG/ML IM SUSP
150.0000 mg | Freq: Once | INTRAMUSCULAR | Status: AC
Start: 2023-11-09 — End: 2023-11-09
  Administered 2023-11-09: 150 mg via INTRAMUSCULAR

## 2023-11-09 NOTE — Progress Notes (Signed)
    NURSE VISIT NOTE  Subjective:    Patient ID: Whitney Rios, female    DOB: 12-Jan-2006, 18 y.o.   MRN: [id number]  HPI  Patient is a 18 y.o. G63P2002 female who presents for depo provera injection.   Objective:    BP 102/64   Pulse 80   Ht 5\' 2"  (1.575 m)   Wt 103 lb 8 oz (46.9 kg)   BMI 18.93 kg/m   Last Annual: n/a due to age. Last pap: n/a due to age. Last Depo-Provera: 08/13/23. Side Effects if any: n/a. Serum HCG indicated? No . Depo-Provera 150 mg IM given by: Whitney Rios, CMA. Site: Left Upper Outer Quandrant    Assessment:   1. Encounter for Depo-Provera contraception      Plan:   Next appointment due between 01/25/24 and 02/08/24.    Whitney Rios, CMA

## 2024-02-01 ENCOUNTER — Ambulatory Visit: Payer: Medicaid Other

## 2024-02-01 DIAGNOSIS — Z3042 Encounter for surveillance of injectable contraceptive: Secondary | ICD-10-CM

## 2024-02-07 ENCOUNTER — Ambulatory Visit

## 2024-02-07 VITALS — BP 119/60 | HR 87 | Ht 63.0 in | Wt 114.0 lb

## 2024-02-07 DIAGNOSIS — Z3042 Encounter for surveillance of injectable contraceptive: Secondary | ICD-10-CM

## 2024-02-07 MED ORDER — MEDROXYPROGESTERONE ACETATE 150 MG/ML IM SUSY
150.0000 mg | PREFILLED_SYRINGE | Freq: Once | INTRAMUSCULAR | Status: AC
Start: 2024-02-07 — End: 2024-02-07
  Administered 2024-02-07: 150 mg via INTRAMUSCULAR

## 2024-02-07 MED ORDER — MEDROXYPROGESTERONE ACETATE 150 MG/ML IM SUSY
150.0000 mg | PREFILLED_SYRINGE | Freq: Once | INTRAMUSCULAR | Status: AC
Start: 2024-02-07 — End: ?

## 2024-02-07 NOTE — Progress Notes (Signed)
    NURSE VISIT NOTE  Subjective:    Patient ID: Leonard Raker, female    DOB: [birth year], 18 y.o.   MRN: [id number]  HPI  Patient is a 18 y.o. G28P2002 female who presents for depo provera  injection.   Objective:    BP 119/60   Pulse 87   Ht 5\' 3"  (1.6 m)   Wt 114 lb (51.7 kg)   Breastfeeding No   BMI 20.19 kg/m   Last Annual: age. Last pap: n/a. Last Depo-Provera : 11/09/2023. Side Effects if any: none. Serum HCG indicated? No . Depo-Provera  150 mg IM given by: Ila Malay, CMA. Site: Right Ventrogluteal   Assessment:   1. Encounter for Depo-Provera  contraception   2. Surveillance for Depo-Provera  contraception      Plan:   Next appointment due between JUL17-JUL31     Abram Abraham, CMA

## 2024-02-23 ENCOUNTER — Other Ambulatory Visit: Payer: Self-pay

## 2024-02-23 ENCOUNTER — Emergency Department
Admission: EM | Admit: 2024-02-23 | Discharge: 2024-02-23 | Disposition: A | Discharge status: Home / Self Care | Attending: Emergency Medicine | Admitting: Emergency Medicine

## 2024-02-23 DIAGNOSIS — A084 Viral intestinal infection, unspecified: Secondary | ICD-10-CM | POA: Insufficient documentation

## 2024-02-23 DIAGNOSIS — D72829 Elevated white blood cell count, unspecified: Secondary | ICD-10-CM | POA: Diagnosis not present

## 2024-02-23 DIAGNOSIS — R1110 Vomiting, unspecified: Secondary | ICD-10-CM | POA: Diagnosis present

## 2024-02-23 DIAGNOSIS — J45909 Unspecified asthma, uncomplicated: Secondary | ICD-10-CM | POA: Diagnosis not present

## 2024-02-23 LAB — COMPREHENSIVE METABOLIC PANEL WITH GFR
ALT: 16 U/L (ref 0–44)
AST: 21 U/L (ref 15–41)
Albumin: 4.5 g/dL (ref 3.5–5.0)
Alkaline Phosphatase: 58 U/L (ref 38–126)
Anion gap: 14 (ref 5–15)
BUN: 11 mg/dL (ref 6–20)
CO2: 16 mmol/L — ABNORMAL LOW (ref 22–32)
Calcium: 9.4 mg/dL (ref 8.9–10.3)
Chloride: 107 mmol/L (ref 98–111)
Creatinine, Ser: 0.66 mg/dL (ref 0.44–1.00)
GFR, Estimated: >60 mL/min (ref >60)
Glucose, Bld: 139 mg/dL — ABNORMAL HIGH (ref 70–99)
Potassium: 4 mmol/L (ref 3.5–5.1)
Sodium: 137 mmol/L (ref 135–145)
Total Bilirubin: 0.7 mg/dL (ref 0.0–1.2)
Total Protein: 7.2 g/dL (ref 6.5–8.1)

## 2024-02-23 LAB — URINALYSIS, ROUTINE W REFLEX MICROSCOPIC
Bilirubin Urine: NEGATIVE
Glucose, UA: NEGATIVE mg/dL
Hgb urine dipstick: NEGATIVE
Ketones, ur: 20 mg/dL — AB
Leukocytes,Ua: NEGATIVE
Nitrite: NEGATIVE
Protein, ur: 30 mg/dL — AB
Specific Gravity, Urine: 1.028 (ref 1.005–1.030)
pH: 5 (ref 5.0–8.0)

## 2024-02-23 LAB — CBC
HCT: 46.4 % — ABNORMAL HIGH (ref 36.0–46.0)
Hemoglobin: 16 g/dL — ABNORMAL HIGH (ref 12.0–15.0)
MCH: 28.9 pg (ref 26.0–34.0)
MCHC: 34.5 g/dL (ref 30.0–36.0)
MCV: 83.8 fL (ref 80.0–100.0)
Platelets: 348 10*3/uL (ref 150–400)
RBC: 5.54 MIL/uL — ABNORMAL HIGH (ref 3.87–5.11)
RDW: 13.1 % (ref 11.5–15.5)
WBC: 24.5 10*3/uL — ABNORMAL HIGH (ref 4.0–10.5)
nRBC: 0 % (ref 0.0–0.2)

## 2024-02-23 LAB — LIPASE, BLOOD: Lipase: 21 U/L (ref 11–51)

## 2024-02-23 LAB — POC URINE PREG, ED: Preg Test, Ur: NEGATIVE

## 2024-02-23 MED ORDER — METOCLOPRAMIDE HCL 5 MG/ML IJ SOLN
10.0000 mg | Freq: Once | INTRAMUSCULAR | Status: AC
  Administered 2024-02-23: 10 mg via INTRAVENOUS
  Filled 2024-02-23: qty 2

## 2024-02-23 MED ORDER — SODIUM CHLORIDE 0.9 % IV BOLUS
1000.0000 mL | Freq: Once | INTRAVENOUS | Status: AC
  Administered 2024-02-23: 1000 mL via INTRAVENOUS

## 2024-02-23 MED ORDER — ONDANSETRON HCL 4 MG/2ML IJ SOLN
4.0000 mg | Freq: Once | INTRAMUSCULAR | Status: DC
  Filled 2024-02-23: qty 2

## 2024-02-23 MED ORDER — METOCLOPRAMIDE HCL 10 MG PO TABS: 10.0000 mg | ORAL_TABLET | Freq: Three times a day (TID) | ORAL | 0 refills | Status: DC | PRN

## 2024-02-23 NOTE — ED Triage Notes (Signed)
 Pt having vomiting and diarrhea that started yesterday. Mother has it as well. Unable to eat or drink anything.

## 2024-02-23 NOTE — Discharge Instructions (Signed)
 Tried to eat a bland diet for the next 2 to 3 days.  This could include bananas, rice, applesauce, toast, plain pasta.  Slowly introduce regular foods back into your system.  Drink plenty of fluids.  If you are worsening return emergency department.

## 2024-02-23 NOTE — ED Notes (Signed)
AVS with prescriptions provided to and discussed with patient and family member at bedside. Pt verbalizes understanding of discharge instructions and denies any questions or concerns at this time. Pt has ride home. Pt ambulated out of department independently with steady gait.  

## 2024-02-23 NOTE — ED Provider Notes (Signed)
 [facility] Provider Note    Event Date/Time   First MD Initiated Contact with Patient 02/23/24 1359     (approximate)   History   Emesis   HPI  Whitney Rios is a 18 y.o. female history of asthma and GERD presents emergency department with vomiting diarrhea started about 9 PM last night.  Mother also has same symptoms.  States she has not been able to eat or drink anything since last night.  States diarrhea has started to get better but she did vomit on the way to the hospital.  States Zofran  usually makes her vomit more but Reglan  usually helps.  She denies fever but did have chills.  No burning with urination.  No frank abdominal pain.      Physical Exam   Triage Vital Signs: ED Triage Vitals  Encounter Vitals Group     BP 02/23/24 1355 (!) 135/99     Systolic BP Percentile --      Diastolic BP Percentile --      Pulse Rate 02/23/24 1355 (!) 106     Resp 02/23/24 1355 18     Temp 02/23/24 1355 98.4 F (36.9 C)     Temp Source 02/23/24 1355 Oral     SpO2 02/23/24 1355 99 %     Weight --      Height --      Head Circumference --      Peak Flow --      Pain Score 02/23/24 1356 7     Pain Loc --      Pain Education --      Exclude from Growth Chart --     Most recent vital signs: Vitals:   02/23/24 1355 02/23/24 1623  BP: (!) 135/99 122/69  Pulse: (!) 106 97  Resp: 18 14  Temp: 98.4 F (36.9 C) 98.4 F (36.9 C)  SpO2: 99% 99%     General: Awake, no distress.   CV:  Good peripheral perfusion. regular rate and  rhythm Resp:  Normal effort. Lungs CTA Abd:  No distention.  Nontender, bowel sounds normal all 4 quads Other:      ED Results / Procedures / Treatments   Labs (all labs ordered are listed, but only abnormal results are displayed) Labs Reviewed  COMPREHENSIVE METABOLIC PANEL WITH GFR - Abnormal; Notable for the following components:      Result Value   CO2 16 (*)    Glucose, Bld 139 (*)    All other components  within normal limits  CBC - Abnormal; Notable for the following components:   WBC 24.5 (*)    RBC 5.54 (*)    Hemoglobin 16.0 (*)    HCT 46.4 (*)    All other components within normal limits  URINALYSIS, ROUTINE W REFLEX MICROSCOPIC - Abnormal; Notable for the following components:   Color, Urine YELLOW (*)    APPearance HAZY (*)    Ketones, ur 20 (*)    Protein, ur 30 (*)    Bacteria, UA RARE (*)    All other components within normal limits  LIPASE, BLOOD  POC URINE PREG, ED     EKG     RADIOLOGY     PROCEDURES:   Procedures  Critical Care:  no Chief Complaint  Patient presents with   Emesis      MEDICATIONS ORDERED IN ED: Medications  sodium chloride  0.9 % bolus 1,000 mL (0 mLs Intravenous Stopped 02/23/24 1522)  metoCLOPramide  (REGLAN ) injection 10 mg (10 mg Intravenous Given 02/23/24 1412)  sodium chloride  0.9 % bolus 1,000 mL (0 mLs Intravenous Stopped 02/23/24 1558)  metoCLOPramide  (REGLAN ) injection 10 mg (10 mg Intravenous Given 02/23/24 1558)     IMPRESSION / MDM / ASSESSMENT AND PLAN / ED COURSE  I reviewed the triage vital signs and the nursing notes.                              Differential diagnosis includes, but is not limited to, viral gastroenteritis, food poisoning, dehydration, UTI, pyelonephritis, cyclic vomiting  Patient's presentation is most consistent with acute illness / injury with system symptoms.    Labs, IV fluids x 2l, Reglan  10mg  x 2  Labs with elevated WBC of 24.5 and elevated H&H indicating dehydration, comprehensive metabolic panel urinalysis reassuring, lipase reassuring, POC pregnancy reassuring  I did explain all the findings to the patient.  She feels much better with IV fluids.  Since her abdomen is nontender and there are no red flags we will not do any imaging today.  However she was given strict instructions to return emergency department if she develops abdominal pain along with vomiting or diarrhea.  She was  given a prescription for Reglan  for vomiting.  Follow-up with her regular doctor in 2 to 3 days as needed.  Return if worsening.  Discharged stable condition.      FINAL CLINICAL IMPRESSION(S) / ED DIAGNOSES   Final diagnoses:  Viral gastroenteritis     Rx / DC Orders   ED Discharge Orders          Ordered    metoCLOPramide  (REGLAN ) 10 MG tablet  Every 8 hours PRN        02/23/24 1514             Note:  This document was prepared using Dragon voice recognition software and may include unintentional dictation errors.    Delsie Figures, PA-C 02/23/24 1645    Arline Bennett, MD 02/23/24 Erminia Hazel

## 2024-02-23 NOTE — ED Notes (Signed)
Water given for PO trial 

## 2024-04-07 ENCOUNTER — Telehealth: Payer: Self-pay

## 2024-04-07 NOTE — Progress Notes (Signed)
 Complex Care Management Note Care Guide Note  04/07/2024 Name: Whitney Rios MRN: [id number] DOB: 11/22/2005   Complex Care Management Outreach Attempts: An unsuccessful telephone outreach was attempted today to offer the patient information about available complex care management services.  Follow Up Plan:  Additional outreach attempts will be made to offer the patient complex care management information and services.   Encounter Outcome:  Patient Request to Call Back  Dreama [facility]  [facility], [facility] Health Care Management Assistant Direct Dial: [phone]  Fax: [phone]

## 2024-04-09 NOTE — Progress Notes (Signed)
 Complex Care Management Note Care Guide Note  04/09/2024 Name: Whitney Rios MRN: [id number] DOB: 12/14/2005   Complex Care Management Outreach Attempts: A second unsuccessful outreach was attempted today to offer the patient with information about available complex care management services.  Follow Up Plan:  Additional outreach attempts will be made to offer the patient complex care management information and services.   Encounter Outcome:  Patient Request to Call Back  Dreama [facility]  [facility], [facility] Health Care Management Assistant Direct Dial: [phone]  Fax: [phone]

## 2024-04-09 NOTE — Progress Notes (Signed)
 Thank you for this referral. The [facility] team receives referrals for eligible patients: all patients with [facility] Primary Care Provider (any health plan including Medicaid or uninsured) and patients with a [facility] Primary Care Provider AND contracted health plan.     This patient does not have a [facility] or [facility] Primary Care Provider. [facility] cannot directly provide Care Management services to patients who do not have a qualifying Primary Care Provider. Please call us  if you need further clarification at [phone].    Patients mother states patient currently does not have PCP. Given E2C[facility] community service line for assistance. Patient will update plan.  [facility]  [facility], [facility] Health Care Management Assistant Direct Dial: [phone]  Fax: [phone]

## 2024-04-24 ENCOUNTER — Ambulatory Visit (INDEPENDENT_AMBULATORY_CARE_PROVIDER_SITE_OTHER)

## 2024-04-24 VITALS — BP 102/66 | HR 80 | Ht 63.0 in | Wt 117.7 lb

## 2024-04-24 DIAGNOSIS — Z3042 Encounter for surveillance of injectable contraceptive: Secondary | ICD-10-CM | POA: Diagnosis not present

## 2024-04-24 MED ORDER — MEDROXYPROGESTERONE ACETATE 150 MG/ML IM SUSP
150.0000 mg | Freq: Once | INTRAMUSCULAR | Status: AC
  Administered 2024-04-24: 150 mg via INTRAMUSCULAR

## 2024-04-24 NOTE — Progress Notes (Addendum)
    NURSE VISIT NOTE  Subjective:    Patient ID: Lacinda CHRISTELLA Free, female    DOB: Dec 03, 2005, 18 y.o.   MRN: [id number]  HPI  Patient is a 18 y.o. G33P2002 female who presents for depo provera  injection.   Objective:    BP 102/66   Pulse 80   Ht 5' 3 (1.6 m)   Wt 117 lb 11.2 oz (53.4 kg)   LMP 04/21/2024 (Approximate)   BMI 20.85 kg/m   Last Annual: NA . Last pap: NA. Last Depo-Provera : 02/07/24. Side Effects if any: none. Serum HCG indicated? No . Depo-Provera  150 mg IM given by: Mathis Getting, CMA. Site: Left Deltoid  Lab Review  No results found for any visits on 04/24/24.  Assessment:   1. Encounter for management and injection of depo-Provera       Plan:   Next appointment due between 07/09/24 and 07/23/24.    Mathis LITTIE Getting, CMA

## 2024-07-22 NOTE — Progress Notes (Deleted)
    NURSE VISIT NOTE  Subjective:    Patient ID: Whitney Rios, female    DOB: 02-04-2006, 18 y.o.   MRN: [id number]  HPI  Patient is a 18 y.o. G48P2002 female who presents for depo provera  injection.   Objective:    There were no vitals taken for this visit.  Last Annual: ***. Last pap: NA. Last Depo-Provera : 04/24/24. Side Effects if any: none. Serum HCG indicated? No . Depo-Provera  150 mg IM given by: Mathis Getting, CMA. Site: Right Deltoid  Lab Review  No results found for any visits on 07/23/24.  Assessment:   1. Encounter for management and injection of depo-Provera       Plan:   Next appointment due between 10/08/24 and 10/22/24.    Mathis LITTIE Getting, CMA

## 2024-07-22 NOTE — Patient Instructions (Incomplete)

## 2024-07-23 ENCOUNTER — Ambulatory Visit

## 2024-07-23 DIAGNOSIS — Z3042 Encounter for surveillance of injectable contraceptive: Secondary | ICD-10-CM

## 2024-08-15 NOTE — Addendum Note (Signed)
 Addended by: TAFT CAMELIA MATSU on: 08/15/2024 04:44 PM   Modules accepted: Orders, Level of Service

## 2024-09-20 ENCOUNTER — Emergency Department
Admission: EM | Admit: 2024-09-20 | Discharge: 2024-09-20 | Disposition: A | Discharge status: Home / Self Care | Attending: Emergency Medicine | Admitting: Emergency Medicine

## 2024-09-20 ENCOUNTER — Other Ambulatory Visit: Payer: Self-pay

## 2024-09-20 DIAGNOSIS — F12188 Cannabis abuse with other cannabis-induced disorder: Secondary | ICD-10-CM | POA: Insufficient documentation

## 2024-09-20 DIAGNOSIS — J45909 Unspecified asthma, uncomplicated: Secondary | ICD-10-CM | POA: Insufficient documentation

## 2024-09-20 DIAGNOSIS — R1116 Cannabis hyperemesis syndrome: Secondary | ICD-10-CM

## 2024-09-20 LAB — COMPREHENSIVE METABOLIC PANEL WITH GFR
ALT: 9 U/L (ref 0–44)
AST: 18 U/L (ref 15–41)
Albumin: 4.8 g/dL (ref 3.5–5.0)
Alkaline Phosphatase: 82 U/L (ref 38–126)
Anion gap: 17 — ABNORMAL HIGH (ref 5–15)
BUN: 8 mg/dL (ref 6–20)
CO2: 16 mmol/L — ABNORMAL LOW (ref 22–32)
Calcium: 10.1 mg/dL (ref 8.9–10.3)
Chloride: 107 mmol/L (ref 98–111)
Creatinine, Ser: 0.59 mg/dL (ref 0.44–1.00)
GFR, Estimated: >60 mL/min (ref >60)
Glucose, Bld: 137 mg/dL — ABNORMAL HIGH (ref 70–99)
Potassium: 3.8 mmol/L (ref 3.5–5.1)
Sodium: 141 mmol/L (ref 135–145)
Total Bilirubin: 0.3 mg/dL (ref 0.0–1.2)
Total Protein: 7.5 g/dL (ref 6.5–8.1)

## 2024-09-20 LAB — PREGNANCY, URINE: Preg Test, Ur: NEGATIVE

## 2024-09-20 LAB — CBC
HCT: 43.7 % (ref 36.0–46.0)
Hemoglobin: 14.6 g/dL (ref 12.0–15.0)
MCH: 28.1 pg (ref 26.0–34.0)
MCHC: 33.4 g/dL (ref 30.0–36.0)
MCV: 84.2 fL (ref 80.0–100.0)
Platelets: 319 K/uL (ref 150–400)
RBC: 5.19 MIL/uL — ABNORMAL HIGH (ref 3.87–5.11)
RDW: 13.2 % (ref 11.5–15.5)
WBC: 13.6 K/uL — ABNORMAL HIGH (ref 4.0–10.5)
nRBC: 0 % (ref 0.0–0.2)

## 2024-09-20 LAB — URINALYSIS, ROUTINE W REFLEX MICROSCOPIC
Bacteria, UA: NONE SEEN
Bilirubin Urine: NEGATIVE
Glucose, UA: NEGATIVE mg/dL
Hgb urine dipstick: NEGATIVE
Ketones, ur: 20 mg/dL — AB
Leukocytes,Ua: NEGATIVE
Nitrite: NEGATIVE
Protein, ur: 100 mg/dL — AB
Specific Gravity, Urine: 1.021 (ref 1.005–1.030)
pH: 9 — ABNORMAL HIGH (ref 5.0–8.0)

## 2024-09-20 LAB — LIPASE, BLOOD: Lipase: 13 U/L (ref 11–51)

## 2024-09-20 LAB — HCG, QUANTITATIVE, PREGNANCY: hCG, Beta Chain, Quant, S: <1 m[IU]/mL (ref <5)

## 2024-09-20 MED ORDER — SODIUM CHLORIDE 0.9 % IV BOLUS
1500.0000 mL | Freq: Once | INTRAVENOUS | Status: AC
  Administered 2024-09-20: 1500 mL via INTRAVENOUS

## 2024-09-20 MED ORDER — METOCLOPRAMIDE HCL 10 MG PO TABS: 10.0000 mg | ORAL_TABLET | Freq: Three times a day (TID) | ORAL | 0 refills | Status: AC | PRN

## 2024-09-20 MED ORDER — METOCLOPRAMIDE HCL 5 MG/ML IJ SOLN
10.0000 mg | Freq: Once | INTRAMUSCULAR | Status: AC
  Administered 2024-09-20: 10 mg via INTRAVENOUS
  Filled 2024-09-20: qty 2

## 2024-09-20 NOTE — ED Provider Notes (Signed)
 [facility] Provider Note    Event Date/Time   First MD Initiated Contact with Patient 09/20/24 [phone]     (approximate)   History   Abdominal Pain and Emesis   HPI  Whitney Rios is a 18 y.o. female history of reflux esophagitis and asthma previous tonsillectomy   Patient is here with her stepfather.  She advises that she started having burning pain in her upper abdomen with vomiting last night cramping in nature.  Associated with multiple emesis of nonbloody vomit.  She reports it comes in waves of pain.  No fevers or chills.  She reports not able to eat or drink since this started last night.  She would like some water to drink right now.  She feels dry and dehydrated.  Does not believe she is pregnant also on Depo.  Pain is located primarily in the mid to left upper abdomen.  No chest pain no shortness of breath.  When asked patient reports that she does use THC compounds daily.  She has been told in the past that this may be the cause of her symptoms as this seems to be something has happened to her every few months.  Reports she has had the same symptoms happen multiple times sometimes often just a few months apart.  No vaginal bleeding or discharge, reports she does not have regular menses due to Depo     Physical Exam   Triage Vital Signs: ED Triage Vitals  Encounter Vitals Group     BP 09/20/24 0905 123/84     Girls Systolic BP Percentile --      Girls Diastolic BP Percentile --      Boys Systolic BP Percentile --      Boys Diastolic BP Percentile --      Pulse Rate 09/20/24 0905 89     Resp 09/20/24 0905 20     Temp 09/20/24 0905 98 F (36.7 C)     Temp Source 09/20/24 0905 Oral     SpO2 09/20/24 0905 97 %     Weight 09/20/24 0903 120 lb (54.4 kg)     Height 09/20/24 0903 5' 3 (1.6 m)     Head Circumference --      Peak Flow --      Pain Score 09/20/24 0902 8     Pain Loc --      Pain Education --      Exclude from Growth Chart --      Most recent vital signs: Vitals:   09/20/24 0905  BP: 123/84  Pulse: 89  Resp: 20  Temp: 98 F (36.7 C)  SpO2: 97%     General: Awake, sitting upright with emesis bag in hand having some very small amounts of nonbloody emesis but frequent dry heaving.  Peers in some discomfort reports very nauseated feeling CV:  Good peripheral perfusion.  Normal tones and rate Resp:  Normal effort.  Clear bilateral Abd:  No distention.  Reports tenderness to palpation epigastric and left upper quadrant only.  No rebound or guarding.  No pain to palpation in right lower quadrant or [place].  Negative Murphy.  No pain to left lower Other:     ED Results / Procedures / Treatments   Labs (all labs ordered are listed, but only abnormal results are displayed) Labs Reviewed  COMPREHENSIVE METABOLIC PANEL WITH GFR - Abnormal; Notable for the following components:      Result Value   CO2 16 (*)  Glucose, Bld 137 (*)    Anion gap 17 (*)    All other components within normal limits  CBC - Abnormal; Notable for the following components:   WBC 13.6 (*)    RBC 5.19 (*)    All other components within normal limits  URINALYSIS, ROUTINE W REFLEX MICROSCOPIC - Abnormal; Notable for the following components:   Color, Urine YELLOW (*)    APPearance HAZY (*)    pH 9.0 (*)    Ketones, ur 20 (*)    Protein, ur 100 (*)    All other components within normal limits  LIPASE, BLOOD  HCG, QUANTITATIVE, PREGNANCY  PREGNANCY, URINE  BASIC METABOLIC PANEL WITH GFR    RADIOLOGY     PROCEDURES:  Critical Care performed: No  Procedures   MEDICATIONS ORDERED IN ED: Medications  metoCLOPramide  (REGLAN ) injection 10 mg (10 mg Intravenous Given 09/20/24 0924)  sodium chloride  0.9 % bolus 1,500 mL (0 mLs Intravenous Stopped 09/20/24 1059)  metoCLOPramide  (REGLAN ) injection 10 mg (10 mg Intravenous Given 09/20/24 1031)     IMPRESSION / MDM / ASSESSMENT AND PLAN / ED COURSE  I reviewed the triage  vital signs and the nursing notes.                              Differential diagnosis includes, but is not limited to, acute gastritis, no diarrhea like illness, afebrile, she is alert oriented having quite a bit of nausea.  Very low suspicion for cholecystitis, appendicitis, diverticulitis, or acute intra-abdominal pathology such as obstruction, abscess, surgical emergency etc.  Her abdominal exam quite reassuring given the history that she provides of daily THC use and cycles of similar vomiting and episodes in the past and most suspicious to think that she is dehydrated likely with cannabinol induced hyperemesis as the etiology.  I did counsel her on this  Will hydrate, she has previously done well with Reglan  and IV fluid.  Reviewed prior records including last ED visit for similar nature treated with same and had significant improvement.  We will treat her conservatively at this point with plan for serial reevaluation, review of screening labs are pending etc.  At this juncture I do not see a high yield for intra-abdominal imaging and unless we were to see ongoing concern with reassessments after treatment etc.  Patient's presentation is most consistent with acute complicated illness / injury requiring diagnostic workup.   ----------------------------------------- 11:06 AM on 09/20/2024 ----------------------------------------- Patient ambulating in the ER she walked out to the charge nurse desk is drinking water from her thermos.  Reports she is feeling better she would like to be discharged.  She feels well at this time much better.  She appears in no distress, her workup reassuring on her reassessment and desires to discharge feeling much better is reassuring.  She is taking Reglan  previously without side effect.  Will give short prescription.  Counseled patient on careful return precautions.  Return precautions and treatment recommendations and follow-up discussed with the patient who is  agreeable with the plan.        FINAL CLINICAL IMPRESSION(S) / ED DIAGNOSES   Final diagnoses:  Cannabinoid hyperemesis syndrome  Nausea and vomiting, crampy abdominal   Rx / DC Orders   ED Discharge Orders          Ordered    metoCLOPramide  (REGLAN ) 10 MG tablet  Every 8 hours PRN  09/20/24 1103             Note:  This document was prepared using Dragon voice recognition software and may include unintentional dictation errors.   Dicky Anes, MD 09/20/24 [phone]

## 2024-09-20 NOTE — Discharge Instructions (Addendum)
°  If you're unable to see her primary care doctor you may return to the emergency room or go to the [facility] walk-in or [facility] clinic in 1 or 2 days for reexam.  Please return to the emergency room right away if you are to develop a fever, severe nausea, your pain becomes severe or worsens, you are unable to keep food down, begin vomiting any dark or bloody fluid, you develop any dark or bloody stools, feel dehydrated, or other new concerns or symptoms arise.    Please discontinue use of THC or marijuana containing compounds.  I believe that this is likely causing your episodes of severe vomiting that have been coming and going every few months.  No driving today.  Return to the ER right away if you begin experiencing severe pain, fevers, feel dehydrated, symptoms are returning, worsening, or other concerns arise

## 2024-09-20 NOTE — ED Notes (Signed)
 Pt still actively vomiting not able to tolerate PO

## 2024-09-20 NOTE — ED Triage Notes (Signed)
 Pt to ED for abdominal pain and vomiting since last night. Pain is burning. Hx GERD.

## 2024-09-20 NOTE — ED Notes (Signed)
 Pt left with family before this RN was able to go over D/C paper work. Dicky, MD went over prescription and pickup with pt. Follow was discussed but pt left before she could get paperwork from this RN.

## 2024-09-20 NOTE — ED Notes (Signed)
 Pt asked to get Iv removed x2 due to it making her feel weird

## 2024-09-29 ENCOUNTER — Ambulatory Visit

## 2024-09-29 NOTE — Progress Notes (Deleted)
.  aob
